# Patient Record
Sex: Male | Born: 1965 | Race: Black or African American | Hispanic: No | Marital: Married | Smoking: Never smoker
Health system: Southern US, Community
[De-identification: ages and names within clinical notes are randomized; demographics above are authoritative.]

## PROBLEM LIST (undated history)

## (undated) DIAGNOSIS — E079 Disorder of thyroid, unspecified: Secondary | ICD-10-CM

## (undated) HISTORY — PX: THYROID SURGERY: SHX805

---

## 2004-01-17 ENCOUNTER — Encounter (HOSPITAL_COMMUNITY): Admission: RE | Admit: 2004-01-17 | Discharge: 2004-04-16 | Payer: Self-pay | Admitting: Internal Medicine

## 2004-07-06 ENCOUNTER — Encounter (HOSPITAL_COMMUNITY): Admission: RE | Admit: 2004-07-06 | Discharge: 2004-10-04 | Payer: Self-pay | Admitting: Internal Medicine

## 2009-11-08 ENCOUNTER — Emergency Department (HOSPITAL_COMMUNITY): Admission: EM | Admit: 2009-11-08 | Discharge: 2009-11-08 | Payer: Self-pay | Admitting: Emergency Medicine

## 2010-10-15 ENCOUNTER — Encounter: Payer: Self-pay | Admitting: Internal Medicine

## 2015-07-24 ENCOUNTER — Emergency Department (HOSPITAL_COMMUNITY)
Admission: EM | Admit: 2015-07-24 | Discharge: 2015-07-24 | Disposition: A | Discharge status: Home / Self Care | Payer: Self-pay | Attending: Emergency Medicine | Admitting: Emergency Medicine

## 2015-07-24 ENCOUNTER — Encounter (HOSPITAL_COMMUNITY): Payer: Self-pay | Admitting: *Deleted

## 2015-07-24 DIAGNOSIS — K047 Periapical abscess without sinus: Secondary | ICD-10-CM | POA: Insufficient documentation

## 2015-07-24 DIAGNOSIS — Z8639 Personal history of other endocrine, nutritional and metabolic disease: Secondary | ICD-10-CM | POA: Insufficient documentation

## 2015-07-24 DIAGNOSIS — Z23 Encounter for immunization: Secondary | ICD-10-CM | POA: Insufficient documentation

## 2015-07-24 HISTORY — DX: Disorder of thyroid, unspecified: E07.9

## 2015-07-24 MED ORDER — HYDROCODONE-ACETAMINOPHEN 5-325 MG PO TABS: 1.0000 | ORAL_TABLET | ORAL | Status: DC | PRN

## 2015-07-24 MED ORDER — IBUPROFEN 800 MG PO TABS: 800.0000 mg | ORAL_TABLET | Freq: Three times a day (TID) | ORAL | Status: DC | PRN

## 2015-07-24 MED ORDER — PENICILLIN V POTASSIUM 500 MG PO TABS: 500.0000 mg | ORAL_TABLET | Freq: Four times a day (QID) | ORAL | Status: AC

## 2015-07-24 MED ORDER — LIDOCAINE-EPINEPHRINE (PF) 2 %-1:200000 IJ SOLN
10.0000 mL | Freq: Once | INTRAMUSCULAR | Status: AC
  Administered 2015-07-24: 10 mL
  Filled 2015-07-24: qty 20

## 2015-07-24 MED ORDER — TETANUS-DIPHTH-ACELL PERTUSSIS 5-2.5-18.5 LF-MCG/0.5 IM SUSP
0.5000 mL | Freq: Once | INTRAMUSCULAR | Status: AC
  Administered 2015-07-24: 0.5 mL via INTRAMUSCULAR
  Filled 2015-07-24: qty 0.5

## 2015-07-24 NOTE — Discharge Instructions (Signed)
Read the information below.  Use the prescribed medication as directed.  Please discuss all new medications with your pharmacist.  Do not take additional tylenol while taking the prescribed pain medication to avoid overdose.  You may return to the Emergency Department at any time for worsening condition or any new symptoms that concern you.    Please call the dentist listed above within 48 hours to schedule a close follow up appointment.  If you develop fevers, swelling in your face, difficulty swallowing or breathing, return to the ER immediately for a recheck.    Dental Abscess A dental abscess is a collection of pus in or around a tooth. CAUSES This condition is caused by a bacterial infection around the root of the tooth that involves the inner part of the tooth (pulp). It may result from:  Severe tooth decay.  Trauma to the tooth that allows bacteria to enter into the pulp, such as a broken or chipped tooth.  Severe gum disease around a tooth. SYMPTOMS Symptoms of this condition include:  Severe pain in and around the infected tooth.  Swelling and redness around the infected tooth, in the mouth, or in the face.  Tenderness.  Pus drainage.  Bad breath.  Bitter taste in the mouth.  Difficulty swallowing.  Difficulty opening the mouth.  Nausea.  Vomiting.  Chills.  Swollen neck glands.  Fever. DIAGNOSIS This condition is diagnosed with examination of the infected tooth. During the exam, your dentist may tap on the infected tooth. Your dentist will also ask about your medical and dental history and may order X-rays. TREATMENT This condition is treated by eliminating the infection. This may be done with:  Antibiotic medicine.  A root canal. This may be performed to save the tooth.  Pulling (extracting) the tooth. This may also involve draining the abscess. This is done if the tooth cannot be saved. HOME CARE INSTRUCTIONS  Take medicines only as directed by your  dentist.  If you were prescribed antibiotic medicine, finish all of it even if you start to feel better.  Rinse your mouth (gargle) often with salt water to relieve pain or swelling.  Do not drive or operate heavy machinery while taking pain medicine.  Do not apply heat to the outside of your mouth.  Keep all follow-up visits as directed by your dentist. This is important. SEEK MEDICAL CARE IF:  Your pain is worse and is not helped by medicine. SEEK IMMEDIATE MEDICAL CARE IF:  You have a fever or chills.  Your symptoms suddenly get worse.  You have a very bad headache.  You have problems breathing or swallowing.  You have trouble opening your mouth.  You have swelling in your neck or around your eye.   This information is not intended to replace advice given to you by your health care provider. Make sure you discuss any questions you have with your health care provider.   Document Released: 09/10/2005 Document Revised: 01/25/2015 Document Reviewed: 09/07/2014 Elsevier Interactive Patient Education 2016 [employer]    Emergency Department Resource Guide 1) Find a Doctor and Pay Out of Pocket Although you won't have to find out who is covered by your insurance plan, it is a good idea to ask around and get recommendations. You will then need to call the office and see if the doctor you have chosen will accept you as a new patient and what types of options they offer for patients who are self-pay. Some doctors offer discounts or  will set up payment plans for their patients who do not have insurance, but you will need to ask so you aren't surprised when you get to your appointment.  2) Contact Your Local Health Department Not all health departments have doctors that can see patients for sick visits, but many do, so it is worth a call to see if yours does. If you don't know where your local health department is, you can check in your phone book. The CDC also has a tool to help  you locate your state's health department, and many state websites also have listings of all of their local health departments.  3) Find a Walk-in Clinic If your illness is not likely to be very severe or complicated, you may want to try a walk in clinic. These are popping up all over the country in pharmacies, drugstores, and shopping centers. They're usually staffed by nurse practitioners or physician assistants that have been trained to treat common illnesses and complaints. They're usually fairly quick and inexpensive. However, if you have serious medical issues or chronic medical problems, these are probably not your best option.  No Primary Care Doctor: - Call Health Connect at  [phone] - they can help you locate a primary care doctor that  accepts your insurance, provides certain services, etc. - Physician Referral Service- [phone]  Chronic Pain Problems: Organization         Address  Phone   Notes  [facility]  [phone] Patients need to be referred by their primary care doctor.   Medication Assistance: Organization         Address  Phone   Notes  [facility] Medication [facility] [address] [place] [place], [place] [address] [phone] --Must be a resident of [facility] -- Must have NO insurance coverage whatsoever (no Medicaid/ Medicare, etc.) -- The pt. MUST have a primary care doctor that directs their care regularly and follows them in the community   Med[facility]ist  [phone]   [employer]  [phone]    Agencies that provide inexpensive medical care: Organization         Address  Phone   Notes  [facility]  [phone]   [facility]    [phone]   [facility] [address] [place], [place] [address] [phone]   [facility] [address] [place] [place] [phone]   [facility]anned Parenthood    [phone]   [facility]    [phone]   [facility]  [address], [place] Phone:  [phone], Fax:  [phone] Hours of Operation:  9 am - 6 pm, M-F.  Also accepts Medicaid/Medicare and self-pay.  [facility]  [address], [place] Phone: [phone], Fax: [phone]. Hours of Operation:  8:30 am - 5:30 pm, M-F.  Also accepts Medicaid and self-pay.  [facility] [place] [address], [place] Phone: [phone]   [facility] [address] Natasha Bence [place], [place] [phone], Ext. [phone] Mondays & Thursdays: 7-9 AM.  First 15 patients are seen on a first come, first serve basis.    Medicaid-accepting [facility] Providers:  Organization         Address  Phone   Notes  [facility] [address], [place] [phone])  [phone] Also accepts self-pay patients.  [facility] [address], [place]  [phone]   [facility] [address], [place] [phone]   [facility] [address] [place] [address], [place] [phone]) [phone]   Renaye RakersVeita Bland [address], [place]   [phone] Only accepts [place] Access [place] patients after they have their name applied to their card.   Self-Pay (no insurance) in [facility]:  Organization         Address  Phone   Notes  Sickle Cell Patients, [facility] [address], [place] [phone]   [facility] [address], [place] [phone]   [facility]  [address], [place] [phone]) [phone]   [facility]/Dr. Osei-Bonsu  [address] [place] [address], [place] or [phone], [place] [phone] Phone number for both [place] and [place] locations is the same.  [facility] [address],  [place] [phone]   [facility] [address] [place] [address], [place] or [address] [phone]   [facility] [address], [place] [phone]) [phone], phone; [phone], fax Sees patients 1st and 3rd Saturday of every month.  Must not qualify for public or private insurance (i.e. Medicaid, Medicare, [place] Health Choice, Veterans' Benefits)  Household income should be no more than 200% of the poverty level The clinic cannot treat you if you are pregnant or think you are pregnant  Sexually transmitted diseases are not treated at the clinic.    Dental Care: Organization         Address  Phone  Notes  [facility] [address], [place] [phone] Accepts children up to age 50 who are enrolled in [place] or [place] Health Choice; pregnant women with a Medicaid card; and children who have applied for Medicaid or [place] Health Choice, but were declined, whose parents can pay a reduced fee at time of service.  [facility] [place]  [address], [place] [phone] Accepts children up to age 50 who are enrolled in [place] or [place] Health Choice; pregnant women with a Medicaid card; and children who have applied for Medicaid or [place] Health Choice, but were declined, whose parents can pay a reduced fee at time of service.  [facility]  [address], [place] [phone]) [phone] Patients are seen by appointment only. Walk-ins are not accepted. [facility] will see patients 50 years of age and older. Monday - Tuesday (8am-5pm) Most Wednesdays (8:30-5pm) $30 per visit, cash only  [facility]  [address], [facility] [phone] Patients are seen by appointment only. Walk-ins are not accepted. [facility] will see patients 50 years of age and older. One Wednesday Evening  (Monthly: Volunteer Based).  $30 per visit, cash only  [facility]  [phone] for adults; Children under age 514, call Graduate Pediatric Dentistry at [phone]. Children aged 554-14, please call [phone] to request a pediatric application.  Dental services are provided in all areas of dental care including fillings, crowns and bridges, complete and partial dentures, implants, gum treatment, root canals, and extractions. Preventive care is also provided.  is provided to both adults and children. °Patients are selected via a lottery and there is often a waiting list. °  °[facility] [address], °[place] ° [phone] [email or web address] °  °[facility] [address], [place], [place] [phone], Ext. [phone] Second and Fourth Thursday of each month, opens at 6:30 AM; Clinic ends at 9 AM.  Patients are seen on a first-come first-served basis, and a limited number are seen during each clinic.  ° °[facility] ° [address], [place], [place] [phone]   Eligibility Requirements °You must have lived in [place], [place], or [place] counties for at least the last three months. °  You cannot be eligible for state or federal sponsored healthcare insurance, including Veterans Administration, Medicaid, or Medicare. °  You generally cannot be eligible for healthcare insurance through your employer.  °  How to apply: °Eligibility screenings are held every Tuesday and Wednesday afternoon from 1:00 pm until 4:00 pm. You do not need an appointment for the interview!  °[facility] [address], [place], [place] [phone]   °[facility]  336-342-8273   °[place]  336-703-3100   °[facility]  336-570-6415   ° °Behavioral Health Resources in the Community: °Intensive Outpatient Programs °Organization         Address  Phone  Notes  °[place]  [facility] [address], [place], [place] [phone]   °[facility] [address], [place], [place] 336-832-9800   °[facility] [address], [place], [place] ° 336-882-2125   °[facility] [address],  °[place], [place] 1-800-853-5163 or 336-641-4981   °Substance Abuse Resources °Organization         Address  Phone  Notes  °[facility]  336-882-2125   °[facility]  336-784-9470   °[facility]  336-285-9073   °[facility]  336-845-3988   °Residential & Outpatient Substance Abuse Program  1-800-659-3381   °Psychological Services °Organization         Address  Phone  Notes  °[facility]  336- 832-9600   °[facility]  336- 378-7881   °[facility] [address], [place] 1-800-853-5163 or 336-641-4981   ° °Mobile Crisis Teams °Organization         Address  Phone  Notes  °[facility], Mobile Crisis Care Unit  1-877-626-1772   °Assertive °[facility]otherapeutic Services ° [address] [place], [place] 336-834-9664   °Sharon DeEsch [address] °[place] 336-554-5454   ° °Self-Help/Support Groups °Organization         Address  Phone             Notes  °[facility] [place] - variety of support groups  336- 373-1402 Call for more information  °Narcotics Anonymous (NA), [facility] [address], °[place]  2 meetings at this location  ° °Residential Treatment Programs °Organization         Address  Phone  Notes  °[facility] [address],    °[place]  1-866-801-8205   °[facility] ° [address], [place], [place] 704-293-8524   °[facility] [address], [place] 336-845-3988 Admissions: 8am-3pm M-F  °[facility] [address]    °[place], [place]   336-841-1104   °[facility] [address],  [place], [place] 336-379-7146   °[facility] [address]  °[place], [place] 336-285-9073   °[facility] Intensive Outpatient [address], [place], [place] 336-852-3033   °[facility] [address]  °[place], [place] 1-877-615-2722 or 336-784-9470   °[facility] ([facility]) [address] [place], [place] 336-227-7417 Accepts Medicaid  °[facility] [address]  °[place] 1-800-659-3381 Substance Abuse/Addiction Treatment  ° °[place] [facility] Resources °Organization         Address  Phone  Notes  °[facility]  (888) 581-9988   °Julie Brannon, PhD [address] [place], [place]   (336) 349-5553 or (336) 951-0000   °[facility]   [address] °[place], [place] (336) 349-4454   °[facility] [address], [place], [place] (336) 342-8316 Insurance/Medicaid/sponsorship through [facility]  °[facility] [address]                                    [place], [place] (336) 342-8316 Therapy/tele-psych/case  °[facility] [address]  ° [place], [place] (336) 349-2233    °Dr. Arfeen  (336) 349-4544   °[facility] [place]  [employer] [place] [facility] 1) [address],  °2) [address], [place] °3)  [address], [place] (336) 349-3220 °(336) 342-7768 ° °(336) 342-8140   °[place] Child Abuse Hotline (336) 342-1394 or (336) 342-3537 (After Hours)    ° ° ° ° °

## 2015-07-24 NOTE — ED Provider Notes (Signed)
CSN: [id number]     Arrival date & time 07/24/15  0806 History   First MD Initiated Contact with Patient 07/24/15 0831     Chief Complaint  Patient presents with  . Oral Swelling     (Consider location/radiation/quality/duration/timing/severity/associated sxs/prior Treatment) HPI   Pt presents with right lower jaw swelling near his chin that began two days ago.  It feels like a hard knot that is sore and increasing in size.  Tender to palpation.  Denies fevers, chills, myalgias, sore throat, difficulty swallowing or breathing.  Denies dental pain.    Past Medical History  Diagnosis Date  . Thyroid disease     hyper    Past Surgical History  Procedure Laterality Date  . Thyroid surgery      radiation   History reviewed. No pertinent family history. Social History  Substance Use Topics  . Smoking status: Never Smoker   . Smokeless tobacco: Never Used  . Alcohol Use: Yes     Comment: social    Review of Systems  Constitutional: Negative for fever and chills.  HENT: Positive for facial swelling. Negative for dental problem, drooling, sore throat and trouble swallowing.   Respiratory: Negative for shortness of breath.   Skin: Negative for rash.  Allergic/Immunologic: Negative for immunocompromised state.      Allergies  Review of patient's allergies indicates not on file.  Home Medications   Prior to Admission medications   Not on File   BP 136/88 mmHg  Pulse 65  Temp(Src) 98.8 F (37.1 C) (Oral)  Resp 18  Ht  (1.803 m)  Wt 195 lb (88.451 kg)  BMI 27.21 kg/m2  SpO2 100% Physical Exam  Constitutional: He appears well-developed and well-nourished. No distress.  HENT:  Head: Normocephalic and atraumatic.  Mouth/Throat:    Right second bicuspid with remote fracture.   Neck: Neck supple.  Pulmonary/Chest: Effort normal.  Neurological: He is alert.  Skin: He is not diaphoretic.  Nursing note and vitals reviewed.   ED Course  Procedures  (including critical care time) Labs Review Labs Reviewed - No data to display  Imaging Review No results found. I have personally reviewed and evaluated these images and lab results as part of my medical decision-making.   EKG Interpretation None       INCISION AND DRAINAGE Performed by: Trixie Dredge Consent: Verbal consent obtained. Risks and benefits: risks, benefits and alternatives were discussed Type: abscess  Body area: dental abscess, right lower gingiva adjacent to 2nd bicuspid  Anesthesia: local infiltration  Incision was made with a scalpel.  Local anesthetic: lidocaine 2% with epinephrine  Anesthetic total: 1 ml  Complexity: complex Blunt dissection to break up loculations  Drainage: purulent  Drainage amount: moderate  Patient tolerance: Patient tolerated the procedure well with no immediate complications.     MDM   Final diagnoses:  Dental abscess    Afebrile, nontoxic patient with new obvious dental abscess. Dental obvious is fluctuant and very obvious.  Discussed possibility of I&D in ED as temporizing measure and pt readily agreed as it will help take the pressure off that is causing his pain.  I&D with improvement.  Pt strongly advised that he will still have to follow closely with a dentist and that he should fill his antibiotics upon leaving the ED.  Discussed return precautions.  Clinical findings not concerning for Ludwig's angina.  D/C home with antibiotic, pain medication and dental follow up.  Discussed findings, treatment, and follow up  with patient.  Pt given return precautions.  Pt verbalizes understanding and agrees with plan.         [place]ily Garret Teale, PA-C 07/24/15 [id number]  Lavera [place] Duo Liu, MD 07/24/15 [phone]

## 2015-07-24 NOTE — ED Notes (Signed)
Declined W/C at D/C and was escorted to lobby by RN. 

## 2015-07-24 NOTE — ED Notes (Signed)
Pt reports swelling to RT side of mouth. Pt states he is eating and drinking . Pt denies any difficulty breathing.

## 2021-02-08 ENCOUNTER — Other Ambulatory Visit: Payer: Self-pay

## 2021-02-08 ENCOUNTER — Inpatient Hospital Stay (HOSPITAL_COMMUNITY): Payer: Self-pay

## 2021-02-08 ENCOUNTER — Emergency Department (HOSPITAL_COMMUNITY): Payer: Self-pay

## 2021-02-08 ENCOUNTER — Inpatient Hospital Stay (HOSPITAL_COMMUNITY)
Admission: EM | Admit: 2021-02-08 | Discharge: 2021-02-15 | DRG: 956 | Disposition: A | Discharge status: Rehabilitation Facility | Payer: Self-pay | Attending: Surgery | Admitting: Surgery

## 2021-02-08 ENCOUNTER — Encounter (HOSPITAL_COMMUNITY): Payer: Self-pay | Admitting: Emergency Medicine

## 2021-02-08 DIAGNOSIS — R52 Pain, unspecified: Secondary | ICD-10-CM

## 2021-02-08 DIAGNOSIS — S32401A Unspecified fracture of right acetabulum, initial encounter for closed fracture: Secondary | ICD-10-CM

## 2021-02-08 DIAGNOSIS — S32409A Unspecified fracture of unspecified acetabulum, initial encounter for closed fracture: Secondary | ICD-10-CM

## 2021-02-08 DIAGNOSIS — S42302A Unspecified fracture of shaft of humerus, left arm, initial encounter for closed fracture: Secondary | ICD-10-CM

## 2021-02-08 DIAGNOSIS — S0591XA Unspecified injury of right eye and orbit, initial encounter: Secondary | ICD-10-CM

## 2021-02-08 DIAGNOSIS — S73004A Unspecified dislocation of right hip, initial encounter: Secondary | ICD-10-CM

## 2021-02-08 DIAGNOSIS — S0101XA Laceration without foreign body of scalp, initial encounter: Secondary | ICD-10-CM

## 2021-02-08 DIAGNOSIS — T1490XA Injury, unspecified, initial encounter: Secondary | ICD-10-CM

## 2021-02-08 DIAGNOSIS — S42322A Displaced transverse fracture of shaft of humerus, left arm, initial encounter for closed fracture: Secondary | ICD-10-CM | POA: Insufficient documentation

## 2021-02-08 DIAGNOSIS — Z419 Encounter for procedure for purposes other than remedying health state, unspecified: Secondary | ICD-10-CM

## 2021-02-08 DIAGNOSIS — S32442A Displaced fracture of posterior column [ilioischial] of left acetabulum, initial encounter for closed fracture: Secondary | ICD-10-CM

## 2021-02-08 DIAGNOSIS — S73005A Unspecified dislocation of left hip, initial encounter: Secondary | ICD-10-CM

## 2021-02-08 DIAGNOSIS — S42321A Displaced transverse fracture of shaft of humerus, right arm, initial encounter for closed fracture: Secondary | ICD-10-CM

## 2021-02-08 DIAGNOSIS — S0181XA Laceration without foreign body of other part of head, initial encounter: Secondary | ICD-10-CM

## 2021-02-08 DIAGNOSIS — S32441A Displaced fracture of posterior column [ilioischial] of right acetabulum, initial encounter for closed fracture: Secondary | ICD-10-CM | POA: Insufficient documentation

## 2021-02-08 DIAGNOSIS — T07XXXA Unspecified multiple injuries, initial encounter: Secondary | ICD-10-CM

## 2021-02-08 DIAGNOSIS — S322XXA Fracture of coccyx, initial encounter for closed fracture: Secondary | ICD-10-CM | POA: Diagnosis present

## 2021-02-08 DIAGNOSIS — D696 Thrombocytopenia, unspecified: Secondary | ICD-10-CM | POA: Diagnosis present

## 2021-02-08 DIAGNOSIS — E876 Hypokalemia: Secondary | ICD-10-CM | POA: Diagnosis not present

## 2021-02-08 DIAGNOSIS — H5704 Mydriasis: Secondary | ICD-10-CM | POA: Diagnosis present

## 2021-02-08 DIAGNOSIS — D62 Acute posthemorrhagic anemia: Secondary | ICD-10-CM | POA: Diagnosis present

## 2021-02-08 DIAGNOSIS — S058X1A Other injuries of right eye and orbit, initial encounter: Secondary | ICD-10-CM | POA: Diagnosis present

## 2021-02-08 DIAGNOSIS — S0285XA Fracture of orbit, unspecified, initial encounter for closed fracture: Secondary | ICD-10-CM | POA: Diagnosis present

## 2021-02-08 DIAGNOSIS — Z23 Encounter for immunization: Secondary | ICD-10-CM

## 2021-02-08 DIAGNOSIS — H547 Unspecified visual loss: Secondary | ICD-10-CM | POA: Diagnosis present

## 2021-02-08 DIAGNOSIS — E039 Hypothyroidism, unspecified: Secondary | ICD-10-CM | POA: Diagnosis present

## 2021-02-08 DIAGNOSIS — H538 Other visual disturbances: Secondary | ICD-10-CM | POA: Diagnosis present

## 2021-02-08 DIAGNOSIS — E871 Hypo-osmolality and hyponatremia: Secondary | ICD-10-CM | POA: Diagnosis present

## 2021-02-08 DIAGNOSIS — Y92410 Unspecified street and highway as the place of occurrence of the external cause: Secondary | ICD-10-CM

## 2021-02-08 DIAGNOSIS — S0501XA Injury of conjunctiva and corneal abrasion without foreign body, right eye, initial encounter: Secondary | ICD-10-CM | POA: Diagnosis present

## 2021-02-08 DIAGNOSIS — S36892A Contusion of other intra-abdominal organs, initial encounter: Secondary | ICD-10-CM | POA: Diagnosis present

## 2021-02-08 DIAGNOSIS — G8918 Other acute postprocedural pain: Secondary | ICD-10-CM

## 2021-02-08 DIAGNOSIS — Z20822 Contact with and (suspected) exposure to covid-19: Secondary | ICD-10-CM | POA: Diagnosis present

## 2021-02-08 DIAGNOSIS — Z91014 Allergy to mammalian meats: Secondary | ICD-10-CM

## 2021-02-08 LAB — COMPREHENSIVE METABOLIC PANEL
ALT: 32 U/L (ref 0–44)
AST: 44 U/L — ABNORMAL HIGH (ref 15–41)
Albumin: 3.7 g/dL (ref 3.5–5.0)
Alkaline Phosphatase: 45 U/L (ref 38–126)
Anion gap: 10 (ref 5–15)
BUN: 13 mg/dL (ref 6–20)
CO2: 23 mmol/L (ref 22–32)
Calcium: 8.6 mg/dL — ABNORMAL LOW (ref 8.9–10.3)
Chloride: 104 mmol/L (ref 98–111)
Creatinine, Ser: 1.69 mg/dL — ABNORMAL HIGH (ref 0.61–1.24)
GFR, Estimated: 47 mL/min — ABNORMAL LOW (ref >60)
Glucose, Bld: 171 mg/dL — ABNORMAL HIGH (ref 70–99)
Potassium: 3.5 mmol/L (ref 3.5–5.1)
Sodium: 137 mmol/L (ref 135–145)
Total Bilirubin: 0.8 mg/dL (ref 0.3–1.2)
Total Protein: 6.2 g/dL — ABNORMAL LOW (ref 6.5–8.1)

## 2021-02-08 LAB — CBC
HCT: 38.6 % — ABNORMAL LOW (ref 39.0–52.0)
Hemoglobin: 12.9 g/dL — ABNORMAL LOW (ref 13.0–17.0)
MCH: 31.5 pg (ref 26.0–34.0)
MCHC: 33.4 g/dL (ref 30.0–36.0)
MCV: 94.1 fL (ref 80.0–100.0)
Platelets: 281 10*3/uL (ref 150–400)
RBC: 4.1 MIL/uL — ABNORMAL LOW (ref 4.22–5.81)
RDW: 12.5 % (ref 11.5–15.5)
WBC: 5.7 10*3/uL (ref 4.0–10.5)
nRBC: 0 % (ref 0.0–0.2)

## 2021-02-08 LAB — I-STAT CHEM 8, ED
BUN: 15 mg/dL (ref 6–20)
Calcium, Ion: 1.09 mmol/L — ABNORMAL LOW (ref 1.15–1.40)
Chloride: 104 mmol/L (ref 98–111)
Creatinine, Ser: 1.7 mg/dL — ABNORMAL HIGH (ref 0.61–1.24)
Glucose, Bld: 165 mg/dL — ABNORMAL HIGH (ref 70–99)
HCT: 37 % — ABNORMAL LOW (ref 39.0–52.0)
Hemoglobin: 12.6 g/dL — ABNORMAL LOW (ref 13.0–17.0)
Potassium: 3.5 mmol/L (ref 3.5–5.1)
Sodium: 141 mmol/L (ref 135–145)
TCO2: 23 mmol/L (ref 22–32)

## 2021-02-08 LAB — PROTIME-INR
INR: 1 (ref 0.8–1.2)
Prothrombin Time: 13.2 seconds (ref 11.4–15.2)

## 2021-02-08 LAB — RESP PANEL BY RT-PCR (FLU A&B, COVID) ARPGX2
Influenza A by PCR: NEGATIVE
Influenza B by PCR: NEGATIVE
SARS Coronavirus 2 by RT PCR: NEGATIVE

## 2021-02-08 LAB — LACTIC ACID, PLASMA: Lactic Acid, Venous: 3 mmol/L (ref 0.5–1.9)

## 2021-02-08 LAB — ETHANOL: Alcohol, Ethyl (B): 135 mg/dL — ABNORMAL HIGH (ref <10)

## 2021-02-08 MED ORDER — TETANUS-DIPHTH-ACELL PERTUSSIS 5-2.5-18.5 LF-MCG/0.5 IM SUSY
0.5000 mL | PREFILLED_SYRINGE | Freq: Once | INTRAMUSCULAR | Status: AC
  Administered 2021-02-08: 0.5 mL via INTRAMUSCULAR

## 2021-02-08 MED ORDER — HYDROMORPHONE HCL 1 MG/ML IJ SOLN
INTRAMUSCULAR | Status: AC
  Administered 2021-02-08: 1 mg
  Filled 2021-02-08: qty 1

## 2021-02-08 MED ORDER — ENOXAPARIN SODIUM 30 MG/0.3ML IJ SOSY: 30.0000 mg | PREFILLED_SYRINGE | Freq: Two times a day (BID) | INTRAMUSCULAR | Status: DC

## 2021-02-08 MED ORDER — HYDROMORPHONE HCL 1 MG/ML IJ SOLN
1.0000 mg | INTRAMUSCULAR | Status: DC | PRN
  Administered 2021-02-09 – 2021-02-10 (×3): 1 mg via INTRAVENOUS
  Filled 2021-02-08 (×5): qty 1

## 2021-02-08 MED ORDER — ONDANSETRON 4 MG PO TBDP
4.0000 mg | ORAL_TABLET | Freq: Four times a day (QID) | ORAL | Status: DC | PRN
Start: 2021-02-08 — End: 2021-02-15

## 2021-02-08 MED ORDER — DEXTROSE-NACL 5-0.9 % IV SOLN: INTRAVENOUS | Status: DC

## 2021-02-08 MED ORDER — HYDROMORPHONE HCL 1 MG/ML IJ SOLN
1.0000 mg | Freq: Once | INTRAMUSCULAR | Status: AC
Start: 2021-02-08 — End: 2021-02-08
  Administered 2021-02-08: 1 mg via INTRAVENOUS

## 2021-02-08 MED ORDER — PROPOFOL 10 MG/ML IV BOLUS
INTRAVENOUS | Status: AC | PRN
  Administered 2021-02-08: 50 mg via INTRAVENOUS

## 2021-02-08 MED ORDER — PROPOFOL 10 MG/ML IV BOLUS
0.5000 mg/kg | Freq: Once | INTRAVENOUS | Status: DC
  Filled 2021-02-08: qty 20

## 2021-02-08 MED ORDER — ONDANSETRON HCL 4 MG/2ML IJ SOLN: 4.0000 mg | Freq: Four times a day (QID) | INTRAMUSCULAR | Status: DC | PRN

## 2021-02-08 MED ORDER — CEFAZOLIN IN SODIUM CHLORIDE 3-0.9 GM/100ML-% IV SOLN: 3.0000 g | INTRAVENOUS | Status: DC

## 2021-02-08 MED ORDER — IOHEXOL 300 MG/ML  SOLN
75.0000 mL | Freq: Once | INTRAMUSCULAR | Status: AC | PRN
  Administered 2021-02-08: 75 mL via INTRAVENOUS

## 2021-02-08 MED ORDER — SODIUM CHLORIDE 0.9 % IV SOLN
INTRAVENOUS | Status: AC | PRN
  Administered 2021-02-08: 1000 mL via INTRAVENOUS

## 2021-02-08 MED ORDER — HYDROMORPHONE HCL 1 MG/ML IJ SOLN
INTRAMUSCULAR | Status: AC
  Administered 2021-02-09: 1 mg via INTRAVENOUS
  Filled 2021-02-08: qty 1

## 2021-02-08 MED ORDER — CEFAZOLIN SODIUM-DEXTROSE 2-4 GM/100ML-% IV SOLN
2.0000 g | Freq: Once | INTRAVENOUS | Status: AC
  Administered 2021-02-08: 2 g via INTRAVENOUS
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
## 2021-02-08 NOTE — Progress Notes (Signed)
Orthopedic Tech Progress Note Patient Details:  [name] Jun 05, 1966 [id number] Level 1 Trauma  Patient ID: [name], male   DOB: 10/19/1965, 55 y.o.   MRN: [id number]   [name] 02/08/2021, 10:46 PM

## 2021-02-08 NOTE — ED Notes (Signed)
2nd unit PRBC infusing.
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
## 2021-02-08 NOTE — ED Notes (Signed)
Patient transported to CT scan . 
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
## 2021-02-08 NOTE — ED Notes (Signed)
Patient signed consent form for procedural sedation to manually reduced his right hip by EDP.

## 2021-02-08 NOTE — ED Notes (Signed)
1st unit PRBC infusing 

## 2021-02-09 ENCOUNTER — Inpatient Hospital Stay (HOSPITAL_COMMUNITY): Payer: Self-pay | Admitting: Anesthesiology

## 2021-02-09 ENCOUNTER — Encounter (HOSPITAL_COMMUNITY): Payer: Self-pay

## 2021-02-09 ENCOUNTER — Inpatient Hospital Stay (HOSPITAL_COMMUNITY): Payer: Self-pay

## 2021-02-09 ENCOUNTER — Encounter (HOSPITAL_COMMUNITY): Admission: EM | Disposition: A | Discharge status: Rehabilitation Facility | Payer: Self-pay | Source: Home / Self Care

## 2021-02-09 DIAGNOSIS — S0101XA Laceration without foreign body of scalp, initial encounter: Secondary | ICD-10-CM

## 2021-02-09 DIAGNOSIS — S0181XA Laceration without foreign body of other part of head, initial encounter: Secondary | ICD-10-CM

## 2021-02-09 HISTORY — PX: FACIAL LACERATION REPAIR: SHX6589

## 2021-02-09 LAB — BASIC METABOLIC PANEL
Anion gap: 6 (ref 5–15)
BUN: 12 mg/dL (ref 6–20)
CO2: 25 mmol/L (ref 22–32)
Calcium: 8.1 mg/dL — ABNORMAL LOW (ref 8.9–10.3)
Chloride: 107 mmol/L (ref 98–111)
Creatinine, Ser: 1.45 mg/dL — ABNORMAL HIGH (ref 0.61–1.24)
GFR, Estimated: 57 mL/min — ABNORMAL LOW (ref >60)
Glucose, Bld: 154 mg/dL — ABNORMAL HIGH (ref 70–99)
Potassium: 4.8 mmol/L (ref 3.5–5.1)
Sodium: 138 mmol/L (ref 135–145)

## 2021-02-09 LAB — CBC
HCT: 35 % — ABNORMAL LOW (ref 39.0–52.0)
Hemoglobin: 11.8 g/dL — ABNORMAL LOW (ref 13.0–17.0)
MCH: 31.1 pg (ref 26.0–34.0)
MCHC: 33.7 g/dL (ref 30.0–36.0)
MCV: 92.1 fL (ref 80.0–100.0)
Platelets: 206 10*3/uL (ref 150–400)
RBC: 3.8 MIL/uL — ABNORMAL LOW (ref 4.22–5.81)
RDW: 13.4 % (ref 11.5–15.5)
WBC: 14.2 10*3/uL — ABNORMAL HIGH (ref 4.0–10.5)
nRBC: 0 % (ref 0.0–0.2)

## 2021-02-09 LAB — HIV ANTIBODY (ROUTINE TESTING W REFLEX): HIV Screen 4th Generation wRfx: NONREACTIVE

## 2021-02-09 LAB — ABO/RH: ABO/RH(D): O POS

## 2021-02-09 LAB — MRSA PCR SCREENING: MRSA by PCR: NEGATIVE

## 2021-02-09 SURGERY — REPAIR, LACERATION, FACE
Anesthesia: General | Site: Face

## 2021-02-09 SURGERY — REPAIR, LACERATION, FACE
Anesthesia: General

## 2021-02-09 MED ORDER — OXYCODONE HCL 5 MG/5ML PO SOLN: 5.0000 mg | Freq: Once | ORAL | Status: DC | PRN

## 2021-02-09 MED ORDER — ONDANSETRON HCL 4 MG/2ML IJ SOLN
INTRAMUSCULAR | Status: DC | PRN
  Administered 2021-02-09: 4 mg via INTRAVENOUS

## 2021-02-09 MED ORDER — BACITRACIN-POLYMYXIN B 500-10000 UNIT/GM OP OINT
TOPICAL_OINTMENT | OPHTHALMIC | Status: DC | PRN
  Administered 2021-02-09: 1 via OPHTHALMIC

## 2021-02-09 MED ORDER — ENOXAPARIN SODIUM 30 MG/0.3ML IJ SOSY
30.0000 mg | PREFILLED_SYRINGE | Freq: Once | INTRAMUSCULAR | Status: AC
  Administered 2021-02-09: 30 mg via SUBCUTANEOUS
  Filled 2021-02-09: qty 0.3

## 2021-02-09 MED ORDER — LIDOCAINE-EPINEPHRINE 1 %-1:100000 IJ SOLN
INTRAMUSCULAR | Status: AC
  Filled 2021-02-09: qty 1

## 2021-02-09 MED ORDER — BACITRACIN ZINC 500 UNIT/GM EX OINT
TOPICAL_OINTMENT | CUTANEOUS | Status: AC
  Filled 2021-02-09: qty 28.35

## 2021-02-09 MED ORDER — LIDOCAINE-EPINEPHRINE 1 %-1:100000 IJ SOLN
20.0000 mL | Freq: Once | INTRAMUSCULAR | Status: AC
  Administered 2021-02-09: 20 mL via INTRADERMAL
  Filled 2021-02-09: qty 20

## 2021-02-09 MED ORDER — PROPOFOL 10 MG/ML IV BOLUS
INTRAVENOUS | Status: AC
  Filled 2021-02-09: qty 20

## 2021-02-09 MED ORDER — OXYCODONE HCL 5 MG PO TABS: 5.0000 mg | ORAL_TABLET | Freq: Once | ORAL | Status: DC | PRN

## 2021-02-09 MED ORDER — FENTANYL CITRATE (PF) 100 MCG/2ML IJ SOLN
INTRAMUSCULAR | Status: AC
  Filled 2021-02-09: qty 2

## 2021-02-09 MED ORDER — MIDAZOLAM HCL 5 MG/5ML IJ SOLN
INTRAMUSCULAR | Status: DC | PRN
  Administered 2021-02-09: 2 mg via INTRAVENOUS

## 2021-02-09 MED ORDER — ARTIFICIAL TEARS OPHTHALMIC OINT
1.0000 "application " | TOPICAL_OINTMENT | Freq: Four times a day (QID) | OPHTHALMIC | Status: DC
  Administered 2021-02-09: 1 via OPHTHALMIC
  Filled 2021-02-09: qty 3.5

## 2021-02-09 MED ORDER — DEXMEDETOMIDINE (PRECEDEX) IN NS 20 MCG/5ML (4 MCG/ML) IV SYRINGE
PREFILLED_SYRINGE | INTRAVENOUS | Status: DC | PRN
  Administered 2021-02-09: 12 ug via INTRAVENOUS

## 2021-02-09 MED ORDER — ARTIFICIAL TEARS OPHTHALMIC OINT: TOPICAL_OINTMENT | OPHTHALMIC | Status: DC

## 2021-02-09 MED ORDER — FENTANYL CITRATE (PF) 100 MCG/2ML IJ SOLN
25.0000 ug | INTRAMUSCULAR | Status: DC | PRN
  Administered 2021-02-09: 50 ug via INTRAVENOUS

## 2021-02-09 MED ORDER — SUGAMMADEX SODIUM 200 MG/2ML IV SOLN
INTRAVENOUS | Status: DC | PRN
  Administered 2021-02-09: 300 mg via INTRAVENOUS

## 2021-02-09 MED ORDER — CEFAZOLIN SODIUM-DEXTROSE 2-4 GM/100ML-% IV SOLN
2.0000 g | INTRAVENOUS | Status: AC
  Administered 2021-02-10 (×2): 2 g via INTRAVENOUS
  Filled 2021-02-09: qty 100

## 2021-02-09 MED ORDER — FENTANYL CITRATE (PF) 250 MCG/5ML IJ SOLN
INTRAMUSCULAR | Status: AC
  Filled 2021-02-09: qty 5

## 2021-02-09 MED ORDER — BACITRACIN-POLYMYXIN B 500-10000 UNIT/GM OP OINT
1.0000 "application " | TOPICAL_OINTMENT | Freq: Two times a day (BID) | OPHTHALMIC | Status: DC
  Administered 2021-02-10 – 2021-02-15 (×11): 1 via OPHTHALMIC
  Filled 2021-02-09: qty 3.5

## 2021-02-09 MED ORDER — TRANEXAMIC ACID-NACL 1000-0.7 MG/100ML-% IV SOLN
1000.0000 mg | INTRAVENOUS | Status: DC
  Filled 2021-02-09: qty 100

## 2021-02-09 MED ORDER — DEXAMETHASONE SODIUM PHOSPHATE 10 MG/ML IJ SOLN
INTRAMUSCULAR | Status: DC | PRN
  Administered 2021-02-09: 10 mg via INTRAVENOUS

## 2021-02-09 MED ORDER — ONDANSETRON HCL 4 MG/2ML IJ SOLN
INTRAMUSCULAR | Status: AC
  Filled 2021-02-09: qty 2

## 2021-02-09 MED ORDER — ENSURE PRE-SURGERY PO LIQD
296.0000 mL | Freq: Once | ORAL | Status: AC
  Administered 2021-02-10: 296 mL via ORAL

## 2021-02-09 MED ORDER — PHENYLEPHRINE 40 MCG/ML (10ML) SYRINGE FOR IV PUSH (FOR BLOOD PRESSURE SUPPORT)
PREFILLED_SYRINGE | INTRAVENOUS | Status: AC
  Filled 2021-02-09: qty 20

## 2021-02-09 MED ORDER — PROPOFOL 10 MG/ML IV BOLUS
0.5000 mg/kg | Freq: Once | INTRAVENOUS | Status: DC
  Filled 2021-02-09: qty 20

## 2021-02-09 MED ORDER — BACITRACIN-POLYMYXIN B 500-10000 UNIT/GM OP OINT
1.0000 "application " | TOPICAL_OINTMENT | Freq: Three times a day (TID) | OPHTHALMIC | Status: DC
  Filled 2021-02-09 (×2): qty 3.5

## 2021-02-09 MED ORDER — CHLORHEXIDINE GLUCONATE 4 % EX LIQD
60.0000 mL | Freq: Once | CUTANEOUS | Status: AC
  Administered 2021-02-10: 4 via TOPICAL
  Filled 2021-02-09: qty 60

## 2021-02-09 MED ORDER — AMOXICILLIN 500 MG PO CAPS
500.0000 mg | ORAL_CAPSULE | Freq: Three times a day (TID) | ORAL | Status: DC
  Administered 2021-02-09 – 2021-02-11 (×7): 500 mg via ORAL
  Filled 2021-02-09 (×9): qty 1

## 2021-02-09 MED ORDER — ENSURE PRE-SURGERY PO LIQD
296.0000 mL | Freq: Once | ORAL | Status: DC
  Filled 2021-02-09: qty 296

## 2021-02-09 MED ORDER — PROPOFOL 10 MG/ML IV BOLUS
INTRAVENOUS | Status: AC | PRN
  Administered 2021-02-09: 10 mg via INTRAVENOUS
  Administered 2021-02-09: 50 mg via INTRAVENOUS
  Administered 2021-02-09: 10 mg via INTRAVENOUS

## 2021-02-09 MED ORDER — MIDAZOLAM HCL 2 MG/2ML IJ SOLN
INTRAMUSCULAR | Status: AC
  Filled 2021-02-09: qty 2

## 2021-02-09 MED ORDER — ACETAMINOPHEN 10 MG/ML IV SOLN
1000.0000 mg | Freq: Four times a day (QID) | INTRAVENOUS | Status: DC
  Administered 2021-02-09: 1000 mg via INTRAVENOUS
  Filled 2021-02-09 (×2): qty 100

## 2021-02-09 MED ORDER — SUCCINYLCHOLINE CHLORIDE 200 MG/10ML IV SOSY
PREFILLED_SYRINGE | INTRAVENOUS | Status: AC
  Filled 2021-02-09: qty 10

## 2021-02-09 MED ORDER — METHOCARBAMOL 1000 MG/10ML IJ SOLN
500.0000 mg | Freq: Three times a day (TID) | INTRAVENOUS | Status: DC
  Administered 2021-02-09: 500 mg via INTRAVENOUS
  Filled 2021-02-09: qty 5

## 2021-02-09 MED ORDER — PROPOFOL 10 MG/ML IV BOLUS
INTRAVENOUS | Status: DC | PRN
  Administered 2021-02-09: 150 mg via INTRAVENOUS

## 2021-02-09 MED ORDER — LIDOCAINE HCL (CARDIAC) PF 100 MG/5ML IV SOSY
PREFILLED_SYRINGE | INTRAVENOUS | Status: DC | PRN
  Administered 2021-02-09: 60 mg via INTRAVENOUS

## 2021-02-09 MED ORDER — OXYCODONE HCL 5 MG PO TABS
5.0000 mg | ORAL_TABLET | ORAL | Status: DC | PRN
  Administered 2021-02-09 (×2): 10 mg via ORAL
  Filled 2021-02-09 (×2): qty 2

## 2021-02-09 MED ORDER — ACETAMINOPHEN 325 MG PO TABS
650.0000 mg | ORAL_TABLET | Freq: Four times a day (QID) | ORAL | Status: DC
  Administered 2021-02-09 – 2021-02-13 (×11): 650 mg via ORAL
  Filled 2021-02-09 (×11): qty 2

## 2021-02-09 MED ORDER — ENOXAPARIN SODIUM 30 MG/0.3ML IJ SOSY: 30.0000 mg | PREFILLED_SYRINGE | Freq: Once | INTRAMUSCULAR | Status: DC

## 2021-02-09 MED ORDER — ALBUMIN HUMAN 5 % IV SOLN: INTRAVENOUS | Status: DC | PRN

## 2021-02-09 MED ORDER — BACITRACIN-POLYMYXIN B 500-10000 UNIT/GM OP OINT: TOPICAL_OINTMENT | Freq: Three times a day (TID) | OPHTHALMIC | Status: DC

## 2021-02-09 MED ORDER — PHENYLEPHRINE HCL (PRESSORS) 10 MG/ML IV SOLN
INTRAVENOUS | Status: DC | PRN
  Administered 2021-02-09: 80 ug via INTRAVENOUS
  Administered 2021-02-09 (×3): 120 ug via INTRAVENOUS

## 2021-02-09 MED ORDER — ROCURONIUM BROMIDE 100 MG/10ML IV SOLN
INTRAVENOUS | Status: DC | PRN
  Administered 2021-02-09: 50 mg via INTRAVENOUS

## 2021-02-09 MED ORDER — FENTANYL CITRATE (PF) 100 MCG/2ML IJ SOLN
INTRAMUSCULAR | Status: DC | PRN
  Administered 2021-02-09 (×2): 50 ug via INTRAVENOUS

## 2021-02-09 MED ORDER — ROCURONIUM BROMIDE 10 MG/ML (PF) SYRINGE
PREFILLED_SYRINGE | INTRAVENOUS | Status: AC
  Filled 2021-02-09: qty 10

## 2021-02-09 MED ORDER — LACTATED RINGERS IV SOLN: INTRAVENOUS | Status: DC | PRN

## 2021-02-09 MED ORDER — PROMETHAZINE HCL 25 MG/ML IJ SOLN: 6.2500 mg | INTRAMUSCULAR | Status: DC | PRN

## 2021-02-09 MED ORDER — ARTIFICIAL TEARS OPHTHALMIC OINT
TOPICAL_OINTMENT | OPHTHALMIC | Status: DC
  Administered 2021-02-09: 1 via OPHTHALMIC
  Filled 2021-02-09: qty 3.5

## 2021-02-09 MED ORDER — DOCUSATE SODIUM 100 MG PO CAPS
100.0000 mg | ORAL_CAPSULE | Freq: Two times a day (BID) | ORAL | Status: DC
  Administered 2021-02-09 – 2021-02-15 (×10): 100 mg via ORAL
  Filled 2021-02-09 (×11): qty 1

## 2021-02-09 MED ORDER — ARTIFICIAL TEARS OPHTHALMIC OINT
TOPICAL_OINTMENT | Freq: Every day | OPHTHALMIC | Status: DC
  Filled 2021-02-09: qty 3.5

## 2021-02-09 MED ORDER — DEXAMETHASONE SODIUM PHOSPHATE 10 MG/ML IJ SOLN
INTRAMUSCULAR | Status: AC
  Filled 2021-02-09: qty 1

## 2021-02-09 MED ORDER — LIDOCAINE 2% (20 MG/ML) 5 ML SYRINGE
INTRAMUSCULAR | Status: AC
  Filled 2021-02-09: qty 5

## 2021-02-09 MED ORDER — 0.9 % SODIUM CHLORIDE (POUR BTL) OPTIME
TOPICAL | Status: DC | PRN
  Administered 2021-02-09: 1000 mL

## 2021-02-09 MED ORDER — POVIDONE-IODINE 10 % EX SWAB
2.0000 "application " | Freq: Once | CUTANEOUS | Status: AC
  Administered 2021-02-10: 2 via TOPICAL

## 2021-02-09 MED ORDER — BACITRACIN ZINC 500 UNIT/GM EX OINT
TOPICAL_OINTMENT | Freq: Every day | CUTANEOUS | Status: DC
  Administered 2021-02-15: 1 via TOPICAL
  Filled 2021-02-09: qty 28.4

## 2021-02-09 MED ORDER — METHOCARBAMOL 500 MG PO TABS
500.0000 mg | ORAL_TABLET | Freq: Four times a day (QID) | ORAL | Status: DC
  Administered 2021-02-09 – 2021-02-13 (×10): 500 mg via ORAL
  Filled 2021-02-09 (×10): qty 1

## 2021-02-09 MED ORDER — HYDROMORPHONE HCL 1 MG/ML IJ SOLN
INTRAMUSCULAR | Status: AC
  Administered 2021-02-09: 1 mg via INTRAVENOUS
  Filled 2021-02-09: qty 1

## 2021-02-09 SURGICAL SUPPLY — 50 items
APL SKNCLS STERI-STRIP NONHPOA (GAUZE/BANDAGES/DRESSINGS) ×1
BENZOIN TINCTURE PRP APPL 2/3 (GAUZE/BANDAGES/DRESSINGS) ×1 IMPLANT
BLADE SURG 15 STRL LF DISP TIS (BLADE) IMPLANT
BLADE SURG 15 STRL SS (BLADE)
CANISTER SUCT 3000ML PPV (MISCELLANEOUS) ×2 IMPLANT
CLEANER TIP ELECTROSURG 2X2 (MISCELLANEOUS) ×2 IMPLANT
CORD BIPOLAR FORCEPS 12FT (ELECTRODE) IMPLANT
COVER SURGICAL LIGHT HANDLE (MISCELLANEOUS) ×2 IMPLANT
COVER WAND RF STERILE (DRAPES) ×2 IMPLANT
DRAIN JP 7F FLT 3/4 PRF SI HBL (DRAIN) ×1 IMPLANT
DRAPE HALF SHEET 40X57 (DRAPES) IMPLANT
ELECT COATED BLADE 2.86 ST (ELECTRODE) ×2 IMPLANT
ELECT NDL TIP 2.8 STRL (NEEDLE) IMPLANT
ELECT NEEDLE TIP 2.8 STRL (NEEDLE) IMPLANT
ELECT REM PT RETURN 9FT ADLT (ELECTROSURGICAL) ×2
ELECTRODE REM PT RTRN 9FT ADLT (ELECTROSURGICAL) ×1 IMPLANT
EVACUATOR SILICONE 100CC (DRAIN) ×1 IMPLANT
GLOVE BIO SURGEON STRL SZ7 (GLOVE) ×2 IMPLANT
GOWN STRL REUS W/ TWL LRG LVL3 (GOWN DISPOSABLE) ×2 IMPLANT
GOWN STRL REUS W/TWL LRG LVL3 (GOWN DISPOSABLE) ×4
KIT BASIN OR (CUSTOM PROCEDURE TRAY) ×2 IMPLANT
KIT TURNOVER KIT B (KITS) ×2 IMPLANT
LOCATOR NERVE 3 VOLT (DISPOSABLE) IMPLANT
NDL HYPO 25GX1X1/2 BEV (NEEDLE) IMPLANT
NEEDLE HYPO 25GX1X1/2 BEV (NEEDLE) IMPLANT
NS IRRIG 1000ML POUR BTL (IV SOLUTION) ×2 IMPLANT
PAD ARMBOARD 7.5X6 YLW CONV (MISCELLANEOUS) ×4 IMPLANT
PENCIL SMOKE EVACUATOR (MISCELLANEOUS) ×2 IMPLANT
SET INTBT LACRIMAL .016X.025 (DRAIN) ×1 IMPLANT
SPONGE LAP 4X18 RFD (DISPOSABLE) ×2 IMPLANT
STAPLER VISISTAT 35W (STAPLE) ×1 IMPLANT
STRIP CLOSURE SKIN 1/2X4 (GAUZE/BANDAGES/DRESSINGS) ×1 IMPLANT
SUT CHROMIC 4 0 P 3 18 (SUTURE) IMPLANT
SUT ETHILON 3 0 PS 1 (SUTURE) IMPLANT
SUT ETHILON 5 0 P 3 18 (SUTURE)
SUT ETHILON 5 0 PS 2 18 (SUTURE) IMPLANT
SUT NYLON ETHILON 5-0 P-3 1X18 (SUTURE) IMPLANT
SUT PLAIN 5 0 P 3 18 (SUTURE) IMPLANT
SUT PROLENE 4 0 SH DA (SUTURE) ×1 IMPLANT
SUT SILK 2 0 PERMA HAND 18 BK (SUTURE) IMPLANT
SUT SILK 3 0 REEL (SUTURE) IMPLANT
SUT VIC AB 3-0 PS2 18 (SUTURE)
SUT VIC AB 3-0 PS2 18XBRD (SUTURE) IMPLANT
SUT VIC AB 3-0 SH 27 (SUTURE) ×2
SUT VIC AB 3-0 SH 27X BRD (SUTURE) IMPLANT
SUT VIC AB 4-0 P-3 18X BRD (SUTURE) IMPLANT
SUT VIC AB 4-0 P3 18 (SUTURE)
TOWEL GREEN STERILE FF (TOWEL DISPOSABLE) ×2 IMPLANT
TRAY ENT MC OR (CUSTOM PROCEDURE TRAY) ×2 IMPLANT
WATER STERILE IRR 1000ML POUR (IV SOLUTION) ×2 IMPLANT
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
## 2021-02-09 NOTE — Progress Notes (Signed)
Patient received from PACU s/p bilateral scalp laceration repair w/JP drain to suction in right scalp, sanguinous drainage around right eye and right scalp. Vital signs stable. Patient c/o 10/10 pain, administered Dilaudid and IVF started. Bed in low position, call light within reached. MRSA swab collected and sent to lab. Belongings, pants/shirt/shoes, at bedside. Patient called spouse to notify of admission.

## 2021-02-09 NOTE — Transfer of Care (Signed)
Immediate Anesthesia Transfer of Care Note  Patient: [name]  Procedure(s) Performed: FACIAL LACERATION REPAIR (N/A ) CLOSURE OF FACIAL LACERATION (N/A Face)  Patient Location: PACU  Anesthesia Type:General  Level of Consciousness: drowsy  Airway & Oxygen Therapy: Patient Spontanous Breathing and Patient connected to nasal cannula oxygen  Post-op Assessment: Report given to RN and Post -op Vital signs reviewed and stable  Post vital signs: Reviewed and stable  Last Vitals:  Vitals Value Taken Time  BP 150/94 02/09/21 0514  Temp    Pulse 90 02/09/21 0520  Resp 19 02/09/21 0520  SpO2 100 % 02/09/21 0520  Vitals shown include unvalidated device data.  Last Pain:  Vitals:   02/09/21 0232  TempSrc:   PainSc: 0-No pain         Complications: No complications documented.

## 2021-02-09 NOTE — Anesthesia Postprocedure Evaluation (Signed)
Anesthesia Post Note  Patient: [name]  Procedure(s) Performed: FACIAL LACERATION REPAIR (N/A ) CLOSURE OF FACIAL LACERATION (N/A Face)     Patient location during evaluation: PACU Anesthesia Type: General Level of consciousness: awake and alert Pain management: pain level controlled Vital Signs Assessment: post-procedure vital signs reviewed and stable Respiratory status: spontaneous breathing, nonlabored ventilation, respiratory function stable and patient connected to nasal cannula oxygen Cardiovascular status: stable and blood pressure returned to baseline Anesthetic complications: no   No complications documented.  Last Vitals:  Vitals:   02/09/21 0600 02/09/21 0615  BP: (!) 159/95 (!) 154/90  Pulse: 87 83  Resp: 15 14  Temp:  36.7 C  SpO2: 93% 93%    Last Pain:  Vitals:   02/09/21 0600  TempSrc:   PainSc: Asleep                 Beryle Lathe

## 2021-02-09 NOTE — Anesthesia Procedure Notes (Addendum)
Procedure Name: Intubation Date/Time: 02/09/2021 3:51 AM Performed by: Chrislynn Mosely T, CRNA Pre-anesthesia Checklist: Patient identified, Emergency Drugs available, Suction available and Patient being monitored Patient Re-evaluated:Patient Re-evaluated prior to induction Oxygen Delivery Method: Circle system utilized Preoxygenation: Pre-oxygenation with 100% oxygen Induction Type: IV induction and Rapid sequence Ventilation: Mask ventilation without difficulty and Oral airway inserted - appropriate to patient size Laryngoscope Size: Glidescope and 4 Tube type: Oral Tube size: 7.5 mm Number of attempts: 1 Airway Equipment and Method: Stylet,  Oral airway and Video-laryngoscopy Placement Confirmation: ETT inserted through vocal cords under direct vision,  positive ETCO2 and breath sounds checked- equal and bilateral Secured at: 22 cm Tube secured with: Tape Dental Injury: Teeth and Oropharynx as per pre-operative assessment
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
## 2021-02-09 NOTE — Progress Notes (Signed)
Orthopedic Tech Progress Note Patient Details:  [name] [birth year] [id number]  Ortho Devices Type of Ortho Device: Shoulder immobilizer,Coapt Ortho Device/Splint Location: LUE Ortho Device/Splint Interventions: Ordered,Application   Post Interventions Patient Tolerated: Fair Instructions Provided: Adjustment of device   [employer] 02/09/2021, 1:20 AM

## 2021-02-09 NOTE — Op Note (Signed)
Procedure(s): CLOSURE OF FACIAL LACERATION Procedure Note  [name] male 55 y.o. 02/09/2021  Procedure(s) and Anesthesia Type:    * CLOSURE OF FACIAL LACERATION - General  Surgeon(s) and Role:    * Rejeana Brock, MD - Primary   Indications: I was consulted for management of the extensive complex facial lacerations.  Discussions were had between myself, the trauma surgeon, and the ophthalmologist about how best to take care of this patient's injuries.  Ultimately, ophthalmology felt that delayed repair of the lacrimal injury was best and asked that I move forward with closure of the wound.     Surgeon: Rejeana Brock   Assistants: none  Anesthesia: General endotracheal anesthesia  ASA Class: none    Procedure Detail  CLOSURE OF FACIAL LACERATION  Patient was put sleep and intubated without difficulty.  His face and scalp were prepped and draped in sterile fashion using Betadine solution.  Old blood was removed and the wound irrigated with copious amounts of sterile saline.  Multiple staples have been placed in the trauma bay by the trauma surgeon to try and control hemorrhage.  These were removed and the wound irrigated.  Staples were then used to close the scalp wounds each measuring approximately 20 cm in length.  The right scalp wound was placed a JP drain to bulb suction.  Examination of the facial laceration confirmed the diagnosis of complete avulsion of the upper and lower tear duct canaliculi.  This was confirmed using lacrimal probes.  This was the expected finding based on ophthalmology's evaluation preoperatively.  The wound was irrigated and brought together using deep 4-0 Vicryl sutures and 4-0 Prolene to close the skin.  This was done in an interrupted fashion.  At the end of the case Betadine was cleaned free the skin.  Antibiotic ointment was placed into the right eye.  Steri-Strips were used to close the right eye.  Findings: 30 cm scalp and facial  laceration  Estimated Blood Loss:  less than 50 mL         Drains: JACKSON-PRATT (JP)               Specimens: none              Complications:  * No complications entered in OR log *         Disposition: PACU - hemodynamically stable.         Condition: stable

## 2021-02-09 NOTE — Progress Notes (Signed)
Patient ID: [name], male   DOB: 1966-06-01, 55 y.o.   MRN: [id number]  [name] reports that overall he is feeling better.  His pain is well controlled.  His eye has been steri-stripped closed during the day.  No new eye complaints.    Exam:   Cc OTC rdrs:  OD; 20/100 OS 20/25  Pupils:  OD: fixed, dilated  OS miotic, minimal reaction   IOP T pen OD 21.   OD bedside lighted exam:  External: brow lac stapled; drain in place Lids: (steris removed)  Interrupted prolenes in place; 2+ edema; medial canthus has been reapproximated.  Complete ptosis; no appreciable levator function Conj: SCH Cornea: ung in tear film AC: formed Iris: dilated, intact Lens: appears clear  20D exam:  Vit: clear; no VH; retina: scatterred areas of commotio; no heme; retina attached.   [name] is a 55 yo M with:   -- Closed brow lac -- Reapproximated and closed medial canthal lid lac OD -- Upper and lower canalicular laceration OD.   -- Corneal abrasion OD -- Commotio retinae OD -- Probable traumatic mydriasis OD -- Possible traumatic ptosis OD.    I continue to agree with the decision to first address the patient's more emergent co-existant injuries here at [facility] overnight.  Ultimately he will need the canalicular injuries repaired.  I've contacted [facility] service today.  I spoke to their resident Dr. Melene Muller who has communicated with Dr. Dimple Casey (oculoplastics attending).  They agree to see the patient and planned canalicular repair.  Would recommend outpatient eval at [facility] as soon as feasible following D/C from the hospital.     ** If possible, please contact me (Dr. Randon Goldsmith) 1-2 days prior to anticipated discharge so that I can arrange appointment date and time with Dr. Dimple Casey.   I'm available at [phone]  In the interim will back down lacrilube to QHS OD and baci oph ung BID OD.    I will remain available and continue to follow.

## 2021-02-09 NOTE — ED Notes (Signed)
Dr. Eulah Pont reduced patient's right hip dislocation ( 2nd attempt)  with Dr. Nicanor Alcon .

## 2021-02-09 NOTE — Anesthesia Preprocedure Evaluation (Addendum)
Anesthesia Evaluation  Patient identified by MRN, date of birth, ID band Patient awake    Reviewed: Allergy & Precautions, NPO status , Patient's Chart, lab work & pertinent test results  History of Anesthesia Complications Negative for: history of anesthetic complications  Airway Mallampati: III  TM Distance: >3 FB Neck ROM: Limited    Dental  (+) Dental Advisory Given   Pulmonary neg pulmonary ROS,    Pulmonary exam normal        Cardiovascular negative cardio ROS Normal cardiovascular exam     Neuro/Psych  C-spine not cleared negative psych ROS   GI/Hepatic negative GI ROS, Neg liver ROS,   Endo/Other  Hypothyroidism  Obesity   Renal/GU Renal InsufficiencyRenal disease     Musculoskeletal negative musculoskeletal ROS (+)   Abdominal   Peds  Hematology  (+) anemia ,   Anesthesia Other Findings Left humerus fx Right hip fx/dislocation Facial/scalp laceration Right eye injury Covid test negative   Reproductive/Obstetrics                            Anesthesia Physical Anesthesia Plan  ASA: II and emergent  Anesthesia Plan: General   Post-op Pain Management:    Induction: Intravenous  PONV Risk Score and Plan: 2 and Treatment may vary due to age or medical condition, Ondansetron, Dexamethasone and Midazolam  Airway Management Planned: Oral ETT and Video Laryngoscope Planned  Additional Equipment: None  Intra-op Plan:   Post-operative Plan: Extubation in OR  Informed Consent:   Plan Discussed with: CRNA and Anesthesiologist  Anesthesia Plan Comments:        Anesthesia Quick Evaluation

## 2021-02-09 NOTE — ED Notes (Signed)
Patient signed consent forms for procedural sedation and reduction of right hip dislocation .

## 2021-02-09 NOTE — ED Provider Notes (Addendum)
  Physical Exam  BP 111/78 (BP Location: Right Arm)   Pulse 100   Temp 97.6 F (36.4 C) (Temporal)   Resp 18   Ht 5\' 10"  (1.778 m)   Wt 100 kg   SpO2 100%   BMI 31.63 kg/m   Physical Exam  ED Course/Procedures     .Sedation  Date/Time: 02/09/2021 1:15 AM Performed by: 02/11/2021, MD Authorized by: Cy Blamer, MD   Consent:    Consent obtained:  Written   Consent given by:  Patient   Risks discussed:  Allergic reaction, dysrhythmia, inadequate sedation, nausea, prolonged hypoxia resulting in organ damage, prolonged sedation necessitating reversal, respiratory compromise necessitating ventilatory assistance and intubation and vomiting   Alternatives discussed:  Analgesia without sedation Universal protocol:    Procedure explained and questions answered to patient or proxy's satisfaction: yes     Imaging studies available: yes     Immediately prior to procedure, a time out was called: yes   Indications:    Procedure performed:  Dislocation reduction   Procedure necessitating sedation performed by:  Different physician Pre-sedation assessment:    Time since last food or drink:  6-7 hours    ASA classification: class 1 - normal, healthy patient     Mouth opening:  3 or more finger widths   Mallampati score:  II - soft palate, uvula, fauces visible   Neck mobility: reduced     Pre-sedation assessments completed and reviewed: pre-procedure airway patency not reviewed, pre-procedure cardiovascular function not reviewed, pre-procedure hydration status not reviewed, pre-procedure mental status not reviewed, pre-procedure nausea and vomiting status not reviewed and pre-procedure pain level not reviewed     Pre-sedation assessment completed:  02/09/2021 1:01 AM Immediate pre-procedure details:    Reassessment: Patient reassessed immediately prior to procedure     Reviewed: vital signs, relevant labs/tests and NPO status     Verified: bag valve mask available and emergency  equipment available   Procedure details (see MAR for exact dosages):    Preoxygenation:  Nasal cannula   Sedation:  Propofol   Intended level of sedation: moderate (conscious sedation)   Intra-procedure monitoring:  Blood pressure monitoring, cardiac monitor, continuous capnometry and continuous pulse oximetry   Intra-procedure events: none     Total Provider sedation time (minutes):  15 Post-procedure details:    Post-sedation assessment completed:  02/09/2021 1:17 AM   Attendance: Constant attendance by certified staff until patient recovered     Recovery: Patient returned to pre-procedure baseline     Post-sedation assessments completed and reviewed: post-procedure airway patency not reviewed, post-procedure cardiovascular function not reviewed, post-procedure mental status not reviewed, post-procedure nausea and vomiting status not reviewed and pain score not reviewed     Patient is stable for discharge or admission: yes     Procedure completion:  Tolerated well, no immediate complications    MDM   Dr. 02/11/2021 would like patient transferred to a  A hospital with oculoplastics  218 case d/w Dr. [birth year]20[birth year]1972, patient will not be transferred emergency regarding canalicular injury.  He will be admitted at [facility].         Bernadene Garside, MD 02/09/21 02/11/21    [id number], MD 02/09/21 02/11/21

## 2021-02-09 NOTE — TOC CAGE-AID Note (Signed)
Transition of Care [facility]) - CAGE-AID Screening   Patient Details  Name: Hanish Laraia MRN: [id number] Date of Birth: [birth year]  Transition of Care [facility]) CM/SW Contact:    Katha Hamming, RN Phone Number:[phone] 02/09/2021, 11:04 AM   Clinical Narrative: Arrives after [facility] 5/18 initially level 2 trauma activation upgraded to level 1 d/t hypotension. Humerus fx, right hip fx/dislocation with plans for surgical repair tomorrow.    CAGE-AID Screening:    Have You Ever Felt You Ought to Cut Down on Your Drinking or Drug Use?: No Have People Annoyed You By Critizing Your Drinking Or Drug Use?: No Have You Felt Bad Or Guilty About Your Drinking Or Drug Use?: No Have You Ever Had a Drink or Used Drugs First Thing In The Morning to Steady Your Nerves or to Get Rid of a Hangover?: No CAGE-AID Score: 0  Substance Abuse Education Offered: Yes (reports intermittent alcohol use and daily smoking, denies substance use. declines resources at this time.)  Substance abuse interventions: Other (must comment) (pt declined resources)

## 2021-02-09 NOTE — H&P (Addendum)
Subjective:     [name] is a 55 y.o. male trauma patient that I was called about 35 minutes ago by Dr. Derrell Lolling of the trauma team requesting assistance in closure of his complex and extensive facial and scalp lacerations.  Patient has undergone a CT maxillofacial scan.  Patient complains of blurred vision in his right eye. Dr. Ranae Pila of ophthalmology has been called by the ER attending and is coming in tonight to see the patient.   Review of Systems Pertinent items are noted in HPI.    No FH on file  No pertinent surgical history  Medications - unknown  PMH - unknown  NKDA   Objective:    BP 136/86   Pulse 90   Temp (!) 97.4 F (36.3 C) (Temporal)   Resp 14   Ht 5\' 10"  (1.778 m)   Wt 100 kg   SpO2 100%   BMI 31.63 kg/m    Patient is appropriate and responds to questions/no respiratory distress Facial laceration noted below extending adjacent to but not involving the medial canthus of the right eye/multiple staples have been placed by another provider prior to my arrival in an effort to control bleeding Cranial nerves appear intact with the exception of vision in the right eye which appears to be suboptimal.  Patient describes it as blurred and he has difficulty moving the eye. Facial bones are stable without step-offs or tenderness No malocclusion Pinna normal without mastoid tenderness or ecchymosis Neck without air or contusion/midline Chest sym expansions bilaterally without use of accessory muscles     CT scan maxillofacial -  I reviewed the films with the radiologist myself.  There are no bony fractures seen on the maxillofacial CT scan.   Assessment:  1.  Large laceration involving face and scalp  2.  Decreased ocular movement and visual acuity on the right side   Patient has other orthopedic injuries for which she needs to go to the operating room.  Given the degree of bleeding described prior to placement of the staples and the need to thoroughly  clean the wound, closure under general anesthesia is indicated.  There are no medical contraindications to general anesthesia per the trauma service and ER attending.  We will move ahead with definitive repair tonight.  This patient needs to be seen by ophthalmology due to the decreased visual acuity involving the right eye.  I have discussed this with Dr. and Dr. Denton Lank of the ER.  They have called ophtho (Dr. Daun Peacock) who is coming in now to evaluate the patient.   Addendum:  02/09/21 at 0212  Ophtho evaluated the patient in the ER trauma bay due to concerns about visual acuity and possible disruption of the lacrimal system of the right eye.  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  This is outside of Dr. 0213 scope of practice and requires an oculoplastics specialist.  He discussed with Dr. Ranae Pila of the trauma service and is recommending that I move forward with definitive closure of the laceration with delayed oculoplastics repair of the lacrimal injury.  Dr. Derrell Lolling does not feel that transfer to another facility is possible due to the patient's other injuries.    Addendum:  02/09/21 at 0300   I called and spoke to Dr. 02/11/21.  He spoke with ophthamology directly and confirms that their recommendation is for delayed repair of the lacrimal apparatus with primary closure of the  laceration by me tonight.  We will go to the OR now for closure of the scalp and facial laceration.
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
## 2021-02-09 NOTE — ED Notes (Signed)
Patient transported to OR , report given to nurse anesthetist.
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
## 2021-02-09 NOTE — Plan of Care (Signed)

## 2021-02-09 NOTE — Procedures (Signed)
Procedure: Right tibial traction pin placement  Indication: Right acetabulum fracture  Surgeon: Charma Igo, PA-C  Assist: None  Anesthesia: 12ml 1% lidocaine with epi  EBL: None  Complications: None  Findings: After risks/benefits explained patient desires to undergo procedure. Consent obtained and time out performed. 20ml lidocaine was infiltrated in the subcutaneous tissues and periosteum on both sides. The right lateral and medial tibia were sterilely prepped, marked, and draped. A 2.0 pin was placed 2cm posterior to tibia tubercle and continued through to the other side. Pt tolerated the procedure well.    Freeman Caldron, PA-C Orthopedic Surgery [phone]

## 2021-02-10 ENCOUNTER — Inpatient Hospital Stay (HOSPITAL_COMMUNITY): Payer: Self-pay

## 2021-02-10 ENCOUNTER — Inpatient Hospital Stay (HOSPITAL_COMMUNITY): Payer: Self-pay | Admitting: Certified Registered"

## 2021-02-10 ENCOUNTER — Encounter (HOSPITAL_COMMUNITY): Admission: EM | Disposition: A | Discharge status: Rehabilitation Facility | Payer: Self-pay | Source: Home / Self Care

## 2021-02-10 ENCOUNTER — Encounter (HOSPITAL_COMMUNITY): Payer: Self-pay | Admitting: Otolaryngology

## 2021-02-10 DIAGNOSIS — S73004A Unspecified dislocation of right hip, initial encounter: Secondary | ICD-10-CM | POA: Insufficient documentation

## 2021-02-10 DIAGNOSIS — S73005A Unspecified dislocation of left hip, initial encounter: Secondary | ICD-10-CM

## 2021-02-10 DIAGNOSIS — S32442A Displaced fracture of posterior column [ilioischial] of left acetabulum, initial encounter for closed fracture: Secondary | ICD-10-CM

## 2021-02-10 DIAGNOSIS — S32441A Displaced fracture of posterior column [ilioischial] of right acetabulum, initial encounter for closed fracture: Secondary | ICD-10-CM | POA: Insufficient documentation

## 2021-02-10 DIAGNOSIS — S42322A Displaced transverse fracture of shaft of humerus, left arm, initial encounter for closed fracture: Secondary | ICD-10-CM | POA: Insufficient documentation

## 2021-02-10 DIAGNOSIS — S42321A Displaced transverse fracture of shaft of humerus, right arm, initial encounter for closed fracture: Secondary | ICD-10-CM

## 2021-02-10 HISTORY — PX: ORIF ACETABULAR FRACTURE: SHX5029

## 2021-02-10 HISTORY — PX: ORIF HUMERUS FRACTURE: SHX2126

## 2021-02-10 LAB — BASIC METABOLIC PANEL
Anion gap: 5 (ref 5–15)
BUN: 11 mg/dL (ref 6–20)
CO2: 25 mmol/L (ref 22–32)
Calcium: 8 mg/dL — ABNORMAL LOW (ref 8.9–10.3)
Chloride: 105 mmol/L (ref 98–111)
Creatinine, Ser: 1.19 mg/dL (ref 0.61–1.24)
GFR, Estimated: >60 mL/min (ref >60)
Glucose, Bld: 125 mg/dL — ABNORMAL HIGH (ref 70–99)
Potassium: 3.3 mmol/L — ABNORMAL LOW (ref 3.5–5.1)
Sodium: 135 mmol/L (ref 135–145)

## 2021-02-10 LAB — POCT I-STAT EG7
Acid-Base Excess: 0 mmol/L (ref 0.0–2.0)
Bicarbonate: 26.5 mmol/L (ref 20.0–28.0)
Calcium, Ion: 1.19 mmol/L (ref 1.15–1.40)
HCT: 23 % — ABNORMAL LOW (ref 39.0–52.0)
Hemoglobin: 7.8 g/dL — ABNORMAL LOW (ref 13.0–17.0)
O2 Saturation: 31 %
Potassium: 3.8 mmol/L (ref 3.5–5.1)
Sodium: 139 mmol/L (ref 135–145)
TCO2: 28 mmol/L (ref 22–32)
pCO2, Ven: 51.7 mmHg (ref 44.0–60.0)
pH, Ven: 7.318 (ref 7.250–7.430)
pO2, Ven: 22 mmHg — CL (ref 32.0–45.0)

## 2021-02-10 LAB — PREPARE RBC (CROSSMATCH)

## 2021-02-10 LAB — CBC
HCT: 27.7 % — ABNORMAL LOW (ref 39.0–52.0)
Hemoglobin: 9.2 g/dL — ABNORMAL LOW (ref 13.0–17.0)
MCH: 30.9 pg (ref 26.0–34.0)
MCHC: 33.2 g/dL (ref 30.0–36.0)
MCV: 93 fL (ref 80.0–100.0)
Platelets: 160 10*3/uL (ref 150–400)
RBC: 2.98 MIL/uL — ABNORMAL LOW (ref 4.22–5.81)
RDW: 13.7 % (ref 11.5–15.5)
WBC: 10.8 10*3/uL — ABNORMAL HIGH (ref 4.0–10.5)
nRBC: 0 % (ref 0.0–0.2)

## 2021-02-10 LAB — BLOOD PRODUCT ORDER (VERBAL) VERIFICATION

## 2021-02-10 SURGERY — OPEN REDUCTION INTERNAL FIXATION (ORIF) HUMERAL SHAFT FRACTURE
Anesthesia: General | Site: Shoulder | Laterality: Right

## 2021-02-10 MED ORDER — VANCOMYCIN HCL 1000 MG IV SOLR
INTRAVENOUS | Status: AC
  Filled 2021-02-10: qty 2000

## 2021-02-10 MED ORDER — TRANEXAMIC ACID-NACL 1000-0.7 MG/100ML-% IV SOLN
INTRAVENOUS | Status: DC | PRN
  Administered 2021-02-10: 1000 mg via INTRAVENOUS

## 2021-02-10 MED ORDER — POTASSIUM CHLORIDE 10 MEQ/100ML IV SOLN
10.0000 meq | INTRAVENOUS | Status: AC
Start: 2021-02-10 — End: 2021-02-10
  Administered 2021-02-10 (×2): 10 meq via INTRAVENOUS
  Filled 2021-02-10 (×2): qty 100

## 2021-02-10 MED ORDER — LACTATED RINGERS IV SOLN: INTRAVENOUS | Status: DC

## 2021-02-10 MED ORDER — TOBRAMYCIN SULFATE 1.2 G IJ SOLR
INTRAMUSCULAR | Status: AC
  Filled 2021-02-10: qty 2.4

## 2021-02-10 MED ORDER — SODIUM CHLORIDE 0.9% IV SOLUTION: Freq: Once | INTRAVENOUS | Status: DC

## 2021-02-10 MED ORDER — ENOXAPARIN SODIUM 40 MG/0.4ML IJ SOSY
40.0000 mg | PREFILLED_SYRINGE | INTRAMUSCULAR | Status: DC
  Administered 2021-02-11 – 2021-02-15 (×5): 40 mg via SUBCUTANEOUS
  Filled 2021-02-10 (×5): qty 0.4

## 2021-02-10 MED ORDER — ONDANSETRON HCL 4 MG/2ML IJ SOLN: 4.0000 mg | Freq: Four times a day (QID) | INTRAMUSCULAR | Status: DC | PRN

## 2021-02-10 MED ORDER — METHOCARBAMOL 500 MG PO TABS: 500.0000 mg | ORAL_TABLET | Freq: Four times a day (QID) | ORAL | Status: DC | PRN

## 2021-02-10 MED ORDER — ALBUMIN HUMAN 5 % IV SOLN: INTRAVENOUS | Status: DC | PRN

## 2021-02-10 MED ORDER — OXYCODONE HCL 5 MG PO TABS
10.0000 mg | ORAL_TABLET | ORAL | Status: DC | PRN
  Administered 2021-02-10: 10 mg via ORAL
  Administered 2021-02-11 (×2): 15 mg via ORAL
  Administered 2021-02-11: 10 mg via ORAL
  Administered 2021-02-11 – 2021-02-12 (×2): 15 mg via ORAL
  Filled 2021-02-10: qty 2
  Filled 2021-02-10 (×4): qty 3

## 2021-02-10 MED ORDER — PHENYLEPHRINE 40 MCG/ML (10ML) SYRINGE FOR IV PUSH (FOR BLOOD PRESSURE SUPPORT)
PREFILLED_SYRINGE | INTRAVENOUS | Status: DC | PRN
Start: 2021-02-10 — End: 2021-02-10
  Administered 2021-02-10: 200 ug via INTRAVENOUS

## 2021-02-10 MED ORDER — ROCURONIUM BROMIDE 10 MG/ML (PF) SYRINGE
PREFILLED_SYRINGE | INTRAVENOUS | Status: AC
  Filled 2021-02-10: qty 40

## 2021-02-10 MED ORDER — ONDANSETRON HCL 4 MG/2ML IJ SOLN
INTRAMUSCULAR | Status: DC | PRN
  Administered 2021-02-10: 4 mg via INTRAVENOUS

## 2021-02-10 MED ORDER — PHENYLEPHRINE HCL-NACL 10-0.9 MG/250ML-% IV SOLN
INTRAVENOUS | Status: DC | PRN
  Administered 2021-02-10: 25 ug/min via INTRAVENOUS

## 2021-02-10 MED ORDER — HYDROMORPHONE HCL 1 MG/ML IJ SOLN
0.5000 mg | INTRAMUSCULAR | Status: DC | PRN
  Administered 2021-02-10 – 2021-02-13 (×5): 1 mg via INTRAVENOUS
  Filled 2021-02-10 (×4): qty 1

## 2021-02-10 MED ORDER — CHLORHEXIDINE GLUCONATE 0.12 % MT SOLN: 15.0000 mL | Freq: Once | OROMUCOSAL | Status: AC

## 2021-02-10 MED ORDER — LIDOCAINE HCL (CARDIAC) PF 100 MG/5ML IV SOSY
PREFILLED_SYRINGE | INTRAVENOUS | Status: DC | PRN
  Administered 2021-02-10: 100 mg via INTRAVENOUS

## 2021-02-10 MED ORDER — TRANEXAMIC ACID-NACL 1000-0.7 MG/100ML-% IV SOLN
1000.0000 mg | Freq: Once | INTRAVENOUS | Status: AC
  Administered 2021-02-10: 1000 mg via INTRAVENOUS
  Filled 2021-02-10: qty 100

## 2021-02-10 MED ORDER — HYDROMORPHONE HCL 1 MG/ML IJ SOLN
INTRAMUSCULAR | Status: AC
  Filled 2021-02-10: qty 1

## 2021-02-10 MED ORDER — TOBRAMYCIN SULFATE 1.2 G IJ SOLR
INTRAMUSCULAR | Status: DC | PRN
  Administered 2021-02-10: 1.2 g

## 2021-02-10 MED ORDER — MIDAZOLAM HCL 2 MG/2ML IJ SOLN
INTRAMUSCULAR | Status: AC
  Filled 2021-02-10: qty 2

## 2021-02-10 MED ORDER — VANCOMYCIN HCL 1 G IV SOLR
INTRAVENOUS | Status: DC | PRN
  Administered 2021-02-10: 2000 mg

## 2021-02-10 MED ORDER — SUGAMMADEX SODIUM 200 MG/2ML IV SOLN
INTRAVENOUS | Status: DC | PRN
  Administered 2021-02-10: 200 mg via INTRAVENOUS

## 2021-02-10 MED ORDER — PROPOFOL 10 MG/ML IV BOLUS
INTRAVENOUS | Status: DC | PRN
  Administered 2021-02-10: 200 mg via INTRAVENOUS

## 2021-02-10 MED ORDER — METOCLOPRAMIDE HCL 10 MG PO TABS
5.0000 mg | ORAL_TABLET | Freq: Three times a day (TID) | ORAL | Status: DC | PRN
Start: 2021-02-10 — End: 2021-02-13

## 2021-02-10 MED ORDER — SUFENTANIL CITRATE 50 MCG/ML IV SOLN
INTRAVENOUS | Status: DC | PRN
  Administered 2021-02-10 (×3): 10 ug via INTRAVENOUS

## 2021-02-10 MED ORDER — OXYCODONE HCL 5 MG PO TABS: 5.0000 mg | ORAL_TABLET | Freq: Once | ORAL | Status: DC | PRN

## 2021-02-10 MED ORDER — METHOCARBAMOL 1000 MG/10ML IJ SOLN
500.0000 mg | Freq: Four times a day (QID) | INTRAVENOUS | Status: DC | PRN
  Filled 2021-02-10: qty 5

## 2021-02-10 MED ORDER — METOCLOPRAMIDE HCL 5 MG/ML IJ SOLN: 5.0000 mg | Freq: Three times a day (TID) | INTRAMUSCULAR | Status: DC | PRN

## 2021-02-10 MED ORDER — HYDROMORPHONE HCL 1 MG/ML IJ SOLN
0.2500 mg | INTRAMUSCULAR | Status: DC | PRN
  Administered 2021-02-10 (×4): 0.5 mg via INTRAVENOUS

## 2021-02-10 MED ORDER — POLYETHYLENE GLYCOL 3350 17 G PO PACK: 17.0000 g | PACK | Freq: Every day | ORAL | Status: DC | PRN

## 2021-02-10 MED ORDER — SUFENTANIL CITRATE 50 MCG/ML IV SOLN
INTRAVENOUS | Status: AC
  Filled 2021-02-10: qty 1

## 2021-02-10 MED ORDER — ORAL CARE MOUTH RINSE: 15.0000 mL | Freq: Once | OROMUCOSAL | Status: AC

## 2021-02-10 MED ORDER — DEXAMETHASONE SODIUM PHOSPHATE 10 MG/ML IJ SOLN
INTRAMUSCULAR | Status: DC | PRN
  Administered 2021-02-10: 10 mg via INTRAVENOUS

## 2021-02-10 MED ORDER — OXYCODONE HCL 5 MG/5ML PO SOLN: 5.0000 mg | Freq: Once | ORAL | Status: DC | PRN

## 2021-02-10 MED ORDER — 0.9 % SODIUM CHLORIDE (POUR BTL) OPTIME
TOPICAL | Status: DC | PRN
  Administered 2021-02-10: 1000 mL

## 2021-02-10 MED ORDER — LACTATED RINGERS IV SOLN: INTRAVENOUS | Status: DC | PRN

## 2021-02-10 MED ORDER — OXYCODONE HCL 5 MG PO TABS
5.0000 mg | ORAL_TABLET | ORAL | Status: DC | PRN
  Filled 2021-02-10: qty 2

## 2021-02-10 MED ORDER — MIDAZOLAM HCL 2 MG/2ML IJ SOLN
INTRAMUSCULAR | Status: DC | PRN
  Administered 2021-02-10: 2 mg via INTRAVENOUS

## 2021-02-10 MED ORDER — CHLORHEXIDINE GLUCONATE 0.12 % MT SOLN
OROMUCOSAL | Status: AC
  Administered 2021-02-10: 15 mL via OROMUCOSAL
  Filled 2021-02-10: qty 15

## 2021-02-10 MED ORDER — ROCURONIUM 10MG/ML (10ML) SYRINGE FOR MEDFUSION PUMP - OPTIME
INTRAVENOUS | Status: DC | PRN
  Administered 2021-02-10 (×2): 100 mg via INTRAVENOUS

## 2021-02-10 MED ORDER — CEFAZOLIN SODIUM-DEXTROSE 2-4 GM/100ML-% IV SOLN
2.0000 g | Freq: Three times a day (TID) | INTRAVENOUS | Status: AC
  Administered 2021-02-11 (×3): 2 g via INTRAVENOUS
  Filled 2021-02-10 (×4): qty 100

## 2021-02-10 SURGICAL SUPPLY — 98 items
ADH SKN CLS APL DERMABOND .7 (GAUZE/BANDAGES/DRESSINGS) ×6
APL PRP STRL LF DISP 70% ISPRP (MISCELLANEOUS) ×2
BIT DRILL 2.5X300 (BIT) IMPLANT
BIT DRILL FLUTED 2.5 (BIT) IMPLANT
BIT DRILL Q/COUPLING 1 (BIT) ×1 IMPLANT
BIT DRILL STEP 3.5 (DRILL) IMPLANT
BLADE CLIPPER SURG (BLADE) IMPLANT
BNDG COHESIVE 4X5 TAN STRL (GAUZE/BANDAGES/DRESSINGS) ×3 IMPLANT
BNDG ELASTIC 4X5.8 VLCR STR LF (GAUZE/BANDAGES/DRESSINGS) ×2 IMPLANT
BNDG ELASTIC 6X5.8 VLCR STR LF (GAUZE/BANDAGES/DRESSINGS) ×2 IMPLANT
BRUSH SCRUB EZ PLAIN DRY (MISCELLANEOUS) ×6 IMPLANT
CHLORAPREP W/TINT 26 (MISCELLANEOUS) ×3 IMPLANT
COUNTER NEEDLE 20 DBL MAG RED (NEEDLE) ×1 IMPLANT
COVER SURGICAL LIGHT HANDLE (MISCELLANEOUS) ×6 IMPLANT
COVER WAND RF STERILE (DRAPES) ×3 IMPLANT
DERMABOND ADVANCED (GAUZE/BANDAGES/DRESSINGS) ×3
DERMABOND ADVANCED .7 DNX12 (GAUZE/BANDAGES/DRESSINGS) ×4 IMPLANT
DRAPE C-ARM 42X72 X-RAY (DRAPES) ×3 IMPLANT
DRAPE C-ARMOR (DRAPES) ×3 IMPLANT
DRAPE INCISE IOBAN 66X45 STRL (DRAPES) ×3 IMPLANT
DRAPE INCISE IOBAN 85X60 (DRAPES) ×3 IMPLANT
DRAPE ORTHO SPLIT 77X108 STRL (DRAPES) ×9
DRAPE SURG 17X23 STRL (DRAPES) ×3 IMPLANT
DRAPE SURG ORHT 6 SPLT 77X108 (DRAPES) ×4 IMPLANT
DRAPE U-SHAPE 47X51 STRL (DRAPES) ×6 IMPLANT
DRILL BIT 2.5X300 (BIT) ×3
DRILL BIT FLUTED 2.5 (BIT) ×3
DRILL STEP 3.5 (DRILL)
DRSG MEPILEX BORDER 4X12 (GAUZE/BANDAGES/DRESSINGS) ×2 IMPLANT
DRSG MEPILEX BORDER 4X8 (GAUZE/BANDAGES/DRESSINGS) ×3 IMPLANT
DRSG PAD ABDOMINAL 8X10 ST (GAUZE/BANDAGES/DRESSINGS) ×2 IMPLANT
ELECT BLADE 6.5 EXT (BLADE) ×3 IMPLANT
ELECT REM PT RETURN 9FT ADLT (ELECTROSURGICAL) ×3
ELECTRODE REM PT RTRN 9FT ADLT (ELECTROSURGICAL) ×2 IMPLANT
EVACUATOR 1/8 PVC DRAIN (DRAIN) IMPLANT
GLOVE BIO SURGEON STRL SZ 6.5 (GLOVE) ×9 IMPLANT
GLOVE BIO SURGEON STRL SZ7.5 (GLOVE) ×12 IMPLANT
GLOVE BIOGEL PI IND STRL 7.5 (GLOVE) ×2 IMPLANT
GLOVE BIOGEL PI INDICATOR 7.5 (GLOVE) ×1
GLOVE SURG UNDER POLY LF SZ6.5 (GLOVE) ×3 IMPLANT
GOWN STRL REUS W/ TWL LRG LVL3 (GOWN DISPOSABLE) ×4 IMPLANT
GOWN STRL REUS W/TWL LRG LVL3 (GOWN DISPOSABLE) ×6
HANDPIECE INTERPULSE COAX TIP (DISPOSABLE) ×3
KIT BASIN OR (CUSTOM PROCEDURE TRAY) ×3 IMPLANT
KIT TURNOVER KIT B (KITS) ×3 IMPLANT
MANIFOLD NEPTUNE II (INSTRUMENTS) ×4 IMPLANT
NDL HYPO 25X1 1.5 SAFETY (NEEDLE) ×2 IMPLANT
NEEDLE HYPO 25X1 1.5 SAFETY (NEEDLE) ×3 IMPLANT
NS IRRIG 1000ML POUR BTL (IV SOLUTION) ×3 IMPLANT
PACK ORTHO EXTREMITY (CUSTOM PROCEDURE TRAY) ×3 IMPLANT
PACK SHOULDER (CUSTOM PROCEDURE TRAY) ×1 IMPLANT
PACK TOTAL JOINT (CUSTOM PROCEDURE TRAY) ×3 IMPLANT
PAD ARMBOARD 7.5X6 YLW CONV (MISCELLANEOUS) ×6 IMPLANT
PLATE CRVD RECON LP 3.5X108 8H (Plate) ×1 IMPLANT
PLATE LOCK RECON LP 3.5 5H (Plate) ×1 IMPLANT
PLATE LOCKING 8 HOLE (Plate) ×1 IMPLANT
RETRIEVER SUT HEWSON (MISCELLANEOUS) ×3 IMPLANT
SCREW CORTEX 3.5 26MM (Screw) ×2 IMPLANT
SCREW CORTEX 3.5 36MM (Screw) ×3 IMPLANT
SCREW CORTEX 3.5 38MM (Screw) ×1 IMPLANT
SCREW CORTEX 3.5 40MM (Screw) ×1 IMPLANT
SCREW CORTEX 3.5 45MM (Screw) ×3 IMPLANT
SCREW CORTEX ST 4.5X26 (Screw) ×2 IMPLANT
SCREW CORTEX ST 4.5X28 (Screw) ×1 IMPLANT
SCREW CORTEX ST 4.5X30 (Screw) ×2 IMPLANT
SCREW CORTEX ST 4.5X32 (Screw) ×2 IMPLANT
SCREW LOCK CORT ST 3.5X26 (Screw) IMPLANT
SCREW LOCK CORT ST 3.5X36 (Screw) IMPLANT
SCREW LOCK CORT ST 3.5X38 (Screw) IMPLANT
SCREW LOCK CORT ST 3.5X40 (Screw) IMPLANT
SET HNDPC FAN SPRY TIP SCT (DISPOSABLE) ×2 IMPLANT
SLING ARM FOAM STRAP LRG (SOFTGOODS) ×1 IMPLANT
SPONGE LAP 18X18 RF (DISPOSABLE) ×3 IMPLANT
STAPLER VISISTAT 35W (STAPLE) ×3 IMPLANT
SUCTION FRAZIER HANDLE 10FR (MISCELLANEOUS) ×3
SUCTION TUBE FRAZIER 10FR DISP (MISCELLANEOUS) ×2 IMPLANT
SUT ETHILON 2 0 PSLX (SUTURE) ×6 IMPLANT
SUT ETHILON 3 0 PS 1 (SUTURE) ×6 IMPLANT
SUT FIBERWIRE #2 38 T-5 BLUE (SUTURE) ×6
SUT MNCRL AB 3-0 PS2 18 (SUTURE) ×3 IMPLANT
SUT MON AB 2-0 CT1 36 (SUTURE) ×3 IMPLANT
SUT PROLENE 0 CT (SUTURE) IMPLANT
SUT VIC AB 0 CT1 27 (SUTURE) ×6
SUT VIC AB 0 CT1 27XBRD ANBCTR (SUTURE) ×4 IMPLANT
SUT VIC AB 1 CT1 18XCR BRD 8 (SUTURE) ×2 IMPLANT
SUT VIC AB 1 CT1 27 (SUTURE) ×3
SUT VIC AB 1 CT1 27XBRD ANBCTR (SUTURE) ×2 IMPLANT
SUT VIC AB 1 CT1 8-18 (SUTURE) ×3
SUT VIC AB 2-0 CT1 27 (SUTURE) ×9
SUT VIC AB 2-0 CT1 TAPERPNT 27 (SUTURE) ×4 IMPLANT
SUTURE FIBERWR #2 38 T-5 BLUE (SUTURE) ×4 IMPLANT
SYR CONTROL 10ML LL (SYRINGE) ×3 IMPLANT
TOWEL GREEN STERILE (TOWEL DISPOSABLE) ×9 IMPLANT
TOWEL GREEN STERILE FF (TOWEL DISPOSABLE) ×6 IMPLANT
TRAY FOLEY MTR SLVR 16FR STAT (SET/KITS/TRAYS/PACK) IMPLANT
TUBE CONNECTING 12X1/4 (SUCTIONS) ×4 IMPLANT
WATER STERILE IRR 1000ML POUR (IV SOLUTION) ×2 IMPLANT
YANKAUER SUCT BULB TIP NO VENT (SUCTIONS) IMPLANT

## 2021-02-10 NOTE — Progress Notes (Signed)
SLP Cancellation Note  Patient Details Name: [name] MRN: [id number] DOB: 03-30-1966   Cancelled treatment:        Pt having procedure. Will continue efforts   Royce Macadamia 02/10/2021, 4:00 PM   Breck Coons Lonell Face.Ed Nurse, children's [phone] Office [phone]
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
## 2021-02-10 NOTE — Progress Notes (Signed)
Subjective: Pt reports no new difficulty overnight.  Objective: Vital signs in last 24 hours: Temp:  [97.8 F (36.6 C)-99 F (37.2 C)] 98.3 F (36.8 C) (05/20 1145) Pulse Rate:  [88-97] 91 (05/20 1145) Resp:  [13-22] 20 (05/20 1145) BP: (115-170)/(73-91) 170/91 (05/20 1145) SpO2:  [94 %-97 %] 96 % (05/20 1145) Weight:  [100 kg] 100 kg (05/20 1145)  General appearance: alert and cooperative  Head: Scalp laceration healing with staples in place. JP with serosang. drainage. Facial: Laceration closed with sutures. Right periorbital edema. Ears: Examination of the ears shows normal auricles and external auditory canals bilaterally.  Nose: Nasal examination shows normal mucosa, septum, turbinates.  Mouth: Oral cavity examination shows no mucosal lacerations. No significant trismus is noted.  Neck: Palpation of the neck reveals no lymphadenopathy or mass. The trachea is midline.    Recent Labs    02/09/21 0646 02/10/21 0412  WBC 14.2* 10.8*  HGB 11.8* 9.2*  HCT 35.0* 27.7*  PLT 206 160   Recent Labs    02/09/21 0646 02/10/21 0412  NA 138 135  K 4.8 3.3*  CL 107 105  CO2 25 25  GLUCOSE 154* 125*  BUN 12 11  CREATININE 1.45* 1.19  CALCIUM 8.1* 8.0*    Medications:  I have reviewed the patient's current medications. Scheduled: . [MAR Hold] acetaminophen  650 mg Oral Q6H  . [MAR Hold] amoxicillin  500 mg Oral Q8H  . [MAR Hold] artificial tears   Right Eye QHS  . [MAR Hold] bacitracin   Topical Daily  . [MAR Hold] bacitracin-polymyxin b  1 application Right Eye BID  . [MAR Hold] docusate sodium  100 mg Oral BID  . [MAR Hold] methocarbamol  500 mg Oral QID   Continuous: .  ceFAZolin (ANCEF) IV    . dextrose 5 % and 0.9% NaCl 100 mL/hr at 02/10/21 0405  . lactated ringers 10 mL/hr at 02/10/21 1157  . tranexamic acid     PRN:[MAR Hold]  HYDROmorphone (DILAUDID) injection, [MAR Hold] ondansetron **OR** [MAR Hold] ondansetron (ZOFRAN) IV, [MAR Hold]  oxyCODONE  Assessment/Plan: Extensive facial and scalp lacerations, POD#1 s/p repair. - Will remove sutures and staples next week. - Abx coverage for 5 days.   LOS: 2 days   Mihail Prettyman W Jaspal Pultz 02/10/2021, 12:04 PM

## 2021-02-10 NOTE — Anesthesia Preprocedure Evaluation (Signed)
Anesthesia Evaluation  Patient identified by MRN, date of birth, ID band Patient awake    Reviewed: Allergy & Precautions, H&P , NPO status , Patient's Chart, lab work & pertinent test results  Airway Mallampati: II   Neck ROM: full    Dental   Pulmonary neg pulmonary ROS,    breath sounds clear to auscultation       Cardiovascular negative cardio ROS   Rhythm:regular Rate:Normal     Neuro/Psych    GI/Hepatic   Endo/Other    Renal/GU      Musculoskeletal Humeral shaft fx Acetabular fx   Abdominal   Peds  Hematology   Anesthesia Other Findings   Reproductive/Obstetrics                             Anesthesia Physical Anesthesia Plan  ASA: II  Anesthesia Plan: General   Post-op Pain Management:    Induction: Intravenous  PONV Risk Score and Plan: 2 and Ondansetron, Dexamethasone, Midazolam and Treatment may vary due to age or medical condition  Airway Management Planned: Oral ETT  Additional Equipment:   Intra-op Plan:   Post-operative Plan: Extubation in OR  Informed Consent: I have reviewed the patients History and Physical, chart, labs and discussed the procedure including the risks, benefits and alternatives for the proposed anesthesia with the patient or authorized representative who has indicated his/her understanding and acceptance.     Dental advisory given  Plan Discussed with: CRNA, Anesthesiologist and Surgeon  Anesthesia Plan Comments:         Anesthesia Quick Evaluation

## 2021-02-10 NOTE — Progress Notes (Signed)
Dr. Chaney Malling aware pt had vanilla ensure at 0700 this morning.

## 2021-02-10 NOTE — Transfer of Care (Signed)
Immediate Anesthesia Transfer of Care Note  Patient: [name]  Procedure(s) Performed: OPEN REDUCTION INTERNAL FIXATION (ORIF) HUMERAL SHAFT FRACTURE (Left ) OPEN REDUCTION INTERNAL FIXATION (ORIF) ACETABULAR FRACTURE (Right Shoulder)  Patient Location: PACU  Anesthesia Type:General  Level of Consciousness: drowsy  Airway & Oxygen Therapy: Patient Spontanous Breathing and Patient connected to face mask oxygen  Post-op Assessment: Report given to RN and Post -op Vital signs reviewed and stable  Post vital signs: Reviewed and stable  Last Vitals:  Vitals Value Taken Time  BP    Temp    Pulse    Resp    SpO2      Last Pain:  Vitals:   02/10/21 1145  TempSrc: Oral  PainSc:       Patients Stated Pain Goal: 4 (02/09/21 2111)  Complications: No complications documented.
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
## 2021-02-10 NOTE — Progress Notes (Signed)
OT Cancellation Note  Patient Details Name: [name] MRN: [id number] DOB: Jan 27, 1966   Cancelled Treatment:    Reason Eval/Treat Not Completed: Patient not medically ready (pending surgery- order release 5/21)  Wynona Neat, OTR/L  Acute Rehabilitation Services Pager: [phone] Office: [phone] .  02/10/2021, 8:15 AM

## 2021-02-10 NOTE — Progress Notes (Signed)
Pt to OR via bed.

## 2021-02-10 NOTE — Anesthesia Procedure Notes (Signed)
Procedure Name: Intubation Date/Time: 02/10/2021 1:20 PM Performed by: Melina Schools, CRNA Pre-anesthesia Checklist: Patient identified, Emergency Drugs available, Suction available, Patient being monitored and Timeout performed Patient Re-evaluated:Patient Re-evaluated prior to induction Oxygen Delivery Method: Circle system utilized Preoxygenation: Pre-oxygenation with 100% oxygen Induction Type: IV induction and Cricoid Pressure applied Ventilation: Mask ventilation without difficulty Tube type: Oral Tube size: 7.5 mm Number of attempts: 1 Placement Confirmation: ETT inserted through vocal cords under direct vision,  positive ETCO2 and breath sounds checked- equal and bilateral Secured at: 24 cm Tube secured with: Tape Dental Injury: Teeth and Oropharynx as per pre-operative assessment

## 2021-02-10 NOTE — Anesthesia Postprocedure Evaluation (Signed)
Anesthesia Post Note  Patient: [name]  Procedure(s) Performed: OPEN REDUCTION INTERNAL FIXATION (ORIF) HUMERAL SHAFT FRACTURE (Left ) OPEN REDUCTION INTERNAL FIXATION (ORIF) ACETABULAR FRACTURE (Right Shoulder)     Patient location during evaluation: PACU Anesthesia Type: General Level of consciousness: awake and alert, patient cooperative and oriented Pain management: pain level controlled Vital Signs Assessment: post-procedure vital signs reviewed and stable Respiratory status: spontaneous breathing, nonlabored ventilation, respiratory function stable and patient connected to nasal cannula oxygen Cardiovascular status: blood pressure returned to baseline and stable (transfusing pRBC for operative losses) Postop Assessment: no apparent nausea or vomiting Anesthetic complications: no   No complications documented.  Last Vitals:  Vitals:   02/10/21 1830 02/10/21 1833  BP: (!) 142/85   Pulse: 100 (!) 105  Resp: (!) 23 15  Temp:  36.7 C  SpO2: 96%     Last Pain:  Vitals:   02/10/21 1833  TempSrc: Oral  PainSc:     LLE Motor Response: Purposeful movement;Responds to commands (02/10/21 1830) LLE Sensation: Full sensation (02/10/21 1830)          Erling Cruz. Daveah Varone

## 2021-02-11 ENCOUNTER — Inpatient Hospital Stay (HOSPITAL_COMMUNITY): Payer: Self-pay

## 2021-02-11 LAB — TYPE AND SCREEN
ABO/RH(D): O POS
Antibody Screen: NEGATIVE
Unit division: 0
Unit division: 0
Unit division: 0
Unit division: 0

## 2021-02-11 LAB — BPAM RBC
Blood Product Expiration Date: 202205262359
Blood Product Expiration Date: 202206012359
Blood Product Expiration Date: 202206152359
Blood Product Expiration Date: 202206162359
ISSUE DATE / TIME: 202205182140
ISSUE DATE / TIME: 202205182140
ISSUE DATE / TIME: 202205201824
ISSUE DATE / TIME: 202205201824
Unit Type and Rh: 5100
Unit Type and Rh: 5100
Unit Type and Rh: 5100
Unit Type and Rh: 5100

## 2021-02-11 LAB — CBC
HCT: 28.4 % — ABNORMAL LOW (ref 39.0–52.0)
Hemoglobin: 9.6 g/dL — ABNORMAL LOW (ref 13.0–17.0)
MCH: 30.1 pg (ref 26.0–34.0)
MCHC: 33.8 g/dL (ref 30.0–36.0)
MCV: 89 fL (ref 80.0–100.0)
Platelets: 138 10*3/uL — ABNORMAL LOW (ref 150–400)
RBC: 3.19 MIL/uL — ABNORMAL LOW (ref 4.22–5.81)
RDW: 15 % (ref 11.5–15.5)
WBC: 9.8 10*3/uL (ref 4.0–10.5)
nRBC: 0 % (ref 0.0–0.2)

## 2021-02-11 LAB — BASIC METABOLIC PANEL
Anion gap: 7 (ref 5–15)
BUN: 7 mg/dL (ref 6–20)
CO2: 28 mmol/L (ref 22–32)
Calcium: 8.4 mg/dL — ABNORMAL LOW (ref 8.9–10.3)
Chloride: 99 mmol/L (ref 98–111)
Creatinine, Ser: 1.06 mg/dL (ref 0.61–1.24)
GFR, Estimated: >60 mL/min (ref >60)
Glucose, Bld: 160 mg/dL — ABNORMAL HIGH (ref 70–99)
Potassium: 4 mmol/L (ref 3.5–5.1)
Sodium: 134 mmol/L — ABNORMAL LOW (ref 135–145)

## 2021-02-11 LAB — VITAMIN D 25 HYDROXY (VIT D DEFICIENCY, FRACTURES): Vit D, 25-Hydroxy: 13.45 ng/mL — ABNORMAL LOW (ref 30–100)

## 2021-02-11 MED ORDER — CHLORHEXIDINE GLUCONATE CLOTH 2 % EX PADS
6.0000 | MEDICATED_PAD | Freq: Every day | CUTANEOUS | Status: DC
  Administered 2021-02-11 – 2021-02-14 (×4): 6 via TOPICAL

## 2021-02-11 MED ORDER — LEVOTHYROXINE SODIUM 25 MCG PO TABS
125.0000 ug | ORAL_TABLET | Freq: Every morning | ORAL | Status: DC
  Administered 2021-02-11 – 2021-02-15 (×5): 125 ug via ORAL
  Filled 2021-02-11 (×5): qty 1

## 2021-02-11 MED ORDER — AMOXICILLIN 500 MG PO CAPS
500.0000 mg | ORAL_CAPSULE | Freq: Three times a day (TID) | ORAL | Status: AC
  Administered 2021-02-12 – 2021-02-14 (×9): 500 mg via ORAL
  Filled 2021-02-11 (×10): qty 1
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
## 2021-02-11 NOTE — Plan of Care (Signed)

## 2021-02-11 NOTE — Progress Notes (Addendum)
Orthopaedic Trauma Progress Note  SUBJECTIVE: Doing fairly well this morning, pain controlled on current regimen. No chest pain. No SOB. No nausea/vomiting. No other complaints. Hasn't been up out of bed yet. Wife at bedside  OBJECTIVE:  Vitals:   02/11/21 0630 02/11/21 0704  BP:  (!) 141/82  Pulse: 81 92  Resp: 14 17  Temp:  98.9 F (37.2 C)  SpO2: 99% 100%    General: Sitting up in bed, NAD Respiratory: No increased work of breathing.  LUE: Dressing CDI. Able to wiggle fingers, tolerated gentle elbow motion. Hand warm and well perfused RLE: Dressing CDI. Tender over hip as expected. Ankle motion intact. Compartments soft and compressible. Neurovascularly intact.   IMAGING: Stable post op imaging. CT R hip ordered today  LABS:  Results for orders placed or performed during the hospital encounter of 02/08/21 (from the past 24 hour(s))  POCT I-Stat EG7     Status: Abnormal   Collection Time: 02/10/21  4:31 PM  Result Value Ref Range   pH, Ven 7.318 7.250 - 7.430   pCO2, Ven 51.7 44.0 - 60.0 mmHg   pO2, Ven 22.0 (LL) 32.0 - 45.0 mmHg   Bicarbonate 26.5 20.0 - 28.0 mmol/L   TCO2 28 22 - 32 mmol/L   O2 Saturation 31.0 %   Acid-Base Excess 0.0 0.0 - 2.0 mmol/L   Sodium 139 135 - 145 mmol/L   Potassium 3.8 3.5 - 5.1 mmol/L   Calcium, Ion 1.19 1.15 - 1.40 mmol/L   HCT 23.0 (L) 39.0 - 52.0 %   Hemoglobin 7.8 (L) 13.0 - 17.0 g/dL   Sample type VENOUS    Comment NOTIFIED PHYSICIAN   Prepare RBC (crossmatch)     Status: None   Collection Time: 02/10/21  4:47 PM  Result Value Ref Range   Order Confirmation      ORDERS RECEIVED TO CROSSMATCH Performed at [facility], [address] [place], [place] [address]   CBC     Status: Abnormal   Collection Time: 02/11/21  2:47 AM  Result Value Ref Range   WBC 9.8 4.0 - 10.5 K/uL   RBC 3.19 (L) 4.22 - 5.81 MIL/uL   Hemoglobin 9.6 (L) 13.0 - 17.0 g/dL   HCT 69.4 (L) 85.4 - 62.7 %   MCV 89.0 80.0 - 100.0 fL   MCH 30.1 26.0 -  34.0 pg   MCHC 33.8 30.0 - 36.0 g/dL   RDW 03.5 00.9 - 38.1 %   Platelets 138 (L) 150 - 400 K/uL   nRBC 0.0 0.0 - 0.2 %  Basic metabolic panel     Status: Abnormal   Collection Time: 02/11/21  2:47 AM  Result Value Ref Range   Sodium 134 (L) 135 - 145 mmol/L   Potassium 4.0 3.5 - 5.1 mmol/L   Chloride 99 98 - 111 mmol/L   CO2 28 22 - 32 mmol/L   Glucose, Bld 160 (H) 70 - 99 mg/dL   BUN 7 6 - 20 mg/dL   Creatinine, Ser 8.29 0.61 - 1.24 mg/dL   Calcium 8.4 (L) 8.9 - 10.3 mg/dL   GFR, Estimated >93 >71 mL/min   Anion gap 7 5 - 15    ASSESSMENT: [name] is a 55 y.o. male, 1 Day Post-Op S/P 1. OPEN REDUCTION INTERNAL FIXATION (ORIF) HUMERAL SHAFT FRACTURE 2. OPEN REDUCTION INTERNAL FIXATION (ORIF) ACETABULAR FRACTURE  CV/Blood loss: Hgb 9.6 this morning. Hemodynamically stable  PLAN: Weightbearing: WBAT LLE , TDWB RLE Incisional  and dressing care: Reinforce dressings as needed  Showering: OK to begin showering 02/13/21 Orthopedic device(s): Sling for comfort LUE  Pain management:  1. Tylenol 650 mg q 6 hours scheduled 2. Robaxin 500 mg q 6 hours PRN 3. Oxycodone 5-15 mg q 4 hours PRN 4. Dilaudid 0.5-1 mg q 4 hours PRN VTE prophylaxis: Lovenox, SCDs ID:  Ancef 2gm post op Foley/Lines: No foley.  KVO IVFs Impediments to Fracture Healing: Polytrauma.  Vitamin D level pending, will start supplementation as indicated Dispo: PT/OT evaluation today, dispo pending.  We will plan to change dressings tomorrow versus Monday Follow - up plan: We will follow while in hospital, plan for outpatient follow-up 2 weeks after discharge  Contact information:  Truitt Merle MD, Ulyses Southward PA-C. After hours and holidays please check [email or web address] for group call information for [facility]   Charmin Aguiniga A. Michaelyn Barter, PA-C [phone] (office) [email or web address]
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
## 2021-02-11 NOTE — Progress Notes (Signed)
Inpatient Rehab Admissions Coordinator Note:   Per PT/OT recommendations, pt was screened for CIR candidacy by Wolfgang Phoenix, MS, CCC-SLP.  At this time we are recommending an inpatient rehab consult.  AC will place consult order per protocol.  Please contact me with questions.     Wolfgang Phoenix, MS, CCC-SLP Admissions Coordinator [phone] 02/11/21 4:25 PM

## 2021-02-12 LAB — BASIC METABOLIC PANEL
Anion gap: 6 (ref 5–15)
BUN: 7 mg/dL (ref 6–20)
CO2: 28 mmol/L (ref 22–32)
Calcium: 8.4 mg/dL — ABNORMAL LOW (ref 8.9–10.3)
Chloride: 98 mmol/L (ref 98–111)
Creatinine, Ser: 1 mg/dL (ref 0.61–1.24)
GFR, Estimated: >60 mL/min (ref >60)
Glucose, Bld: 123 mg/dL — ABNORMAL HIGH (ref 70–99)
Potassium: 3.6 mmol/L (ref 3.5–5.1)
Sodium: 132 mmol/L — ABNORMAL LOW (ref 135–145)

## 2021-02-12 LAB — CBC
HCT: 27.3 % — ABNORMAL LOW (ref 39.0–52.0)
Hemoglobin: 9.5 g/dL — ABNORMAL LOW (ref 13.0–17.0)
MCH: 31 pg (ref 26.0–34.0)
MCHC: 34.8 g/dL (ref 30.0–36.0)
MCV: 89.2 fL (ref 80.0–100.0)
Platelets: 144 10*3/uL — ABNORMAL LOW (ref 150–400)
RBC: 3.06 MIL/uL — ABNORMAL LOW (ref 4.22–5.81)
RDW: 14.4 % (ref 11.5–15.5)
WBC: 8.6 10*3/uL (ref 4.0–10.5)
nRBC: 0 % (ref 0.0–0.2)

## 2021-02-12 MED ORDER — SODIUM CHLORIDE 1 G PO TABS
1.0000 g | ORAL_TABLET | Freq: Three times a day (TID) | ORAL | Status: DC
  Administered 2021-02-12 – 2021-02-15 (×10): 1 g via ORAL
  Filled 2021-02-12 (×11): qty 1

## 2021-02-12 MED ORDER — OXYCODONE HCL 5 MG PO TABS
5.0000 mg | ORAL_TABLET | ORAL | Status: DC | PRN
  Administered 2021-02-12 – 2021-02-15 (×8): 10 mg via ORAL
  Filled 2021-02-12 (×8): qty 2

## 2021-02-12 MED ORDER — POLYETHYLENE GLYCOL 3350 17 G PO PACK
17.0000 g | PACK | Freq: Every day | ORAL | Status: DC
  Administered 2021-02-12 – 2021-02-13 (×2): 17 g via ORAL
  Filled 2021-02-12: qty 1

## 2021-02-12 MED ORDER — MENTHOL 3 MG MT LOZG: 1.0000 | LOZENGE | OROMUCOSAL | Status: DC | PRN

## 2021-02-12 NOTE — Progress Notes (Signed)
Inpatient Rehab Admissions:  Inpatient Rehab Consult received.  I met with patient and wife, Zolee at the bedside for rehabilitation assessment and to discuss goals and expectations of an inpatient rehab admission.  Both acknowledge understanding of goals and expectations. Both interested in pt pursuing CIR.  Will continue to follow.  Signed: Gayland Curry, [place], [place] Admissions Coordinator [phone]

## 2021-02-12 NOTE — Progress Notes (Signed)
Subjective: 2 Days Post-Op Procedure(s) (LRB): OPEN REDUCTION INTERNAL FIXATION (ORIF) HUMERAL SHAFT FRACTURE (Left) OPEN REDUCTION INTERNAL FIXATION (ORIF) ACETABULAR FRACTURE (Right)  Patient reports pain as well controlled by meds.    Objective:   VITALS:  Temp:  [98 F (36.7 C)-99.9 F (37.7 C)] 98.9 F (37.2 C) (05/22 1526) Pulse Rate:  [78-104] 97 (05/22 1526) Resp:  [14-22] 17 (05/22 1526) BP: (126-144)/(73-89) 126/78 (05/22 1526) SpO2:  [92 %-98 %] 94 % (05/22 1526)  Neurovascular intact Sensation intact distally Intact pulses distally Dorsiflexion/Plantar flexion intact Incision: dressing C/D/I   LABS Recent Labs    02/10/21 0412 02/10/21 1631 02/11/21 0247 02/12/21 0557  HGB 9.2* 7.8* 9.6* 9.5*  WBC 10.8*  --  9.8 8.6  PLT 160  --  138* 144*   Recent Labs    02/11/21 0247 02/12/21 0557  NA 134* 132*  K 4.0 3.6  CL 99 98  CO2 28 28  BUN 7 7  CREATININE 1.06 1.00  GLUCOSE 160* 123*   No results for input(s): LABPT, INR in the last 72 hours.   Assessment/Plan: 2 Days Post-Op Procedure(s) (LRB): OPEN REDUCTION INTERNAL FIXATION (ORIF) HUMERAL SHAFT FRACTURE (Left) OPEN REDUCTION INTERNAL FIXATION (ORIF) ACETABULAR FRACTURE (Right)  Up with therapy TDWB r lower extremity Will await results of [place] regarding r calf swelling  [name] 02/12/2021, 3:44 PM
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
## 2021-02-13 ENCOUNTER — Encounter (HOSPITAL_COMMUNITY): Payer: Self-pay | Admitting: Student

## 2021-02-13 ENCOUNTER — Inpatient Hospital Stay (HOSPITAL_COMMUNITY): Payer: Self-pay

## 2021-02-13 DIAGNOSIS — M7989 Other specified soft tissue disorders: Secondary | ICD-10-CM

## 2021-02-13 LAB — BASIC METABOLIC PANEL
Anion gap: 4 — ABNORMAL LOW (ref 5–15)
BUN: 7 mg/dL (ref 6–20)
CO2: 31 mmol/L (ref 22–32)
Calcium: 8.8 mg/dL — ABNORMAL LOW (ref 8.9–10.3)
Chloride: 99 mmol/L (ref 98–111)
Creatinine, Ser: 0.95 mg/dL (ref 0.61–1.24)
GFR, Estimated: >60 mL/min (ref >60)
Glucose, Bld: 117 mg/dL — ABNORMAL HIGH (ref 70–99)
Potassium: 3.5 mmol/L (ref 3.5–5.1)
Sodium: 134 mmol/L — ABNORMAL LOW (ref 135–145)

## 2021-02-13 MED ORDER — VITAMIN D (ERGOCALCIFEROL) 1.25 MG (50000 UNIT) PO CAPS
50000.0000 [IU] | ORAL_CAPSULE | ORAL | Status: DC
  Administered 2021-02-13: 50000 [IU] via ORAL
  Filled 2021-02-13: qty 1

## 2021-02-13 MED ORDER — METHOCARBAMOL 500 MG PO TABS
1000.0000 mg | ORAL_TABLET | Freq: Three times a day (TID) | ORAL | Status: DC
  Administered 2021-02-13 – 2021-02-15 (×6): 1000 mg via ORAL
  Filled 2021-02-13 (×6): qty 2

## 2021-02-13 MED ORDER — POLYETHYLENE GLYCOL 3350 17 G PO PACK
17.0000 g | PACK | Freq: Two times a day (BID) | ORAL | Status: DC
  Administered 2021-02-13 – 2021-02-15 (×3): 17 g via ORAL
  Filled 2021-02-13 (×4): qty 1

## 2021-02-13 MED ORDER — ACETAMINOPHEN 500 MG PO TABS
1000.0000 mg | ORAL_TABLET | Freq: Four times a day (QID) | ORAL | Status: DC
  Administered 2021-02-13 – 2021-02-15 (×9): 1000 mg via ORAL
  Filled 2021-02-13 (×9): qty 2

## 2021-02-13 MED ORDER — HYDROMORPHONE HCL 1 MG/ML IJ SOLN: 0.5000 mg | Freq: Three times a day (TID) | INTRAMUSCULAR | Status: DC | PRN

## 2021-02-13 MED ORDER — KETOROLAC TROMETHAMINE 15 MG/ML IJ SOLN
15.0000 mg | Freq: Four times a day (QID) | INTRAMUSCULAR | Status: AC
  Administered 2021-02-13 – 2021-02-14 (×5): 15 mg via INTRAVENOUS
  Filled 2021-02-13 (×5): qty 1

## 2021-02-13 NOTE — Progress Notes (Signed)
Lower extremity venous RT study completed.   Please see CV Proc for preliminary results.   Romir Klimowicz, RDMS, RVT  

## 2021-02-13 NOTE — Progress Notes (Signed)
Orthopaedic Trauma Progress Note  SUBJECTIVE: Doing fairly well this morning, pain continues to be an issue. Rate pain 8/10. Will add Toradol this AM.  No chest pain. No SOB. No nausea/vomiting. No other complaints. Hasn't been up out of bed yet. Wife at bedside. Venous U/S of RLE has been ordered due to significant swelling.   OBJECTIVE:  Vitals:   02/12/21 2328 02/13/21 0531  BP: 121/90 (!) 142/91  Pulse: 97 92  Resp: (!) 21 19  Temp: 99.2 F (37.3 C) 99.1 F (37.3 C)  SpO2: 94% 93%    General: Sitting up in bed, NAD Respiratory: No increased work of breathing.  LUE: Dressing removed, incision CDI. Able to wiggle fingers, tolerated gentle elbow motion. Forward elevation of shoulder to about 65 degrees.  Hand warm and well perfused. +radial pulse RLE: Dressing removed, incision CDI. Tender over hip as expected. Ankle motion intact. Swelling through thigh but compartments compressible throughout extremity. No calf tenderness. Neurovascularly intact.   IMAGING: Stable post op imaging.   LABS:  Results for orders placed or performed during the hospital encounter of 02/08/21 (from the past 24 hour(s))  Basic metabolic panel     Status: Abnormal   Collection Time: 02/13/21  4:05 AM  Result Value Ref Range   Sodium 134 (L) 135 - 145 mmol/L   Potassium 3.5 3.5 - 5.1 mmol/L   Chloride 99 98 - 111 mmol/L   CO2 31 22 - 32 mmol/L   Glucose, Bld 117 (H) 70 - 99 mg/dL   BUN 7 6 - 20 mg/dL   Creatinine, Ser 5.73 0.61 - 1.24 mg/dL   Calcium 8.8 (L) 8.9 - 10.3 mg/dL   GFR, Estimated >22 >02 mL/min   Anion gap 4 (L) 5 - 15    ASSESSMENT: [name] is a 55 y.o. male, 3 Days Post-Op S/P 1. OPEN REDUCTION INTERNAL FIXATION (ORIF) HUMERAL SHAFT FRACTURE 2. OPEN REDUCTION INTERNAL FIXATION (ORIF) ACETABULAR FRACTURE  CV/Blood loss: Hgb 9.5 on 02/12/21, stable. Hemodynamically stable  PLAN: Weightbearing: WBAT LLE , TDWB RLE Incisional and dressing care: Change PRN. Ok to leave open  to air Showering: OK to begin showering 02/13/21 Orthopedic device(s): Sling for comfort LUE  Pain management:  1. Tylenol 1000 mg q 6 hours scheduled 2. Robaxin 500 mg q 6 hours PRN 3. Oxycodone 5-10 mg q 4 hours PRN 4. Dilaudid 0.5-1 mg q 4 hours PRN 5. Toradol 15 mg q 6 hours x 5 doses VTE prophylaxis: Lovenox, SCDs ID:  Ancef 2gm post op completed Foley/Lines: No foley.  KVO IVFs Impediments to Fracture Healing: Polytrauma.  Vitamin D level 13, will start D2 supplementation  Dispo: Therapies as tolerated. PT/OT recommending CIR. Ok for d/c to next venue from ortho standpoint. If d/c home, recommend transition to Eliquis x 30 days. If d/c to CIR, continue Lovenox Follow - up plan: We will follow while in hospital, plan for outpatient follow-up 2 weeks after discharge  Contact information:  Truitt Merle MD, Ulyses Southward PA-C. After hours and holidays please check [email or web address] for group call information for [facility]   Brandon Scarbrough A. Michaelyn Barter, PA-C [phone] (office) [email or web address]
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
## 2021-02-13 NOTE — Progress Notes (Signed)
Subjective: No new c/o. Resting comfortably in chair.  Objective: Vital signs in last 24 hours: Temp:  [98.3 F (36.8 C)-99.2 F (37.3 C)] 98.3 F (36.8 C) (05/23 0900) Pulse Rate:  [82-97] 92 (05/23 0531) Resp:  [17-21] 20 (05/23 0900) BP: (121-142)/(78-91) 131/79 (05/23 0900) SpO2:  [93 %-95 %] 93 % (05/23 0531)  General appearance: alert and cooperative  Head: Scalp laceration healing with staples in place. JP with serosang. drainage. Facial: Laceration closed with sutures. Right periorbital edema. Ears: Examination of the ears shows normal auricles and external auditory canals bilaterally.  Nose: Nasal examination shows normal mucosa, septum, turbinates.  Mouth: Oral cavity examination shows no mucosal lacerations. No significant trismus is noted.  Neck: Palpation of the neck reveals no lymphadenopathy or mass. The trachea is midline.    Recent Labs    02/11/21 0247 02/12/21 0557  WBC 9.8 8.6  HGB 9.6* 9.5*  HCT 28.4* 27.3*  PLT 138* 144*   Recent Labs    02/12/21 0557 02/13/21 0405  NA 132* 134*  K 3.6 3.5  CL 98 99  CO2 28 31  GLUCOSE 123* 117*  BUN 7 7  CREATININE 1.00 0.95  CALCIUM 8.4* 8.8*    Medications:  I have reviewed the patient's current medications. Scheduled: . sodium chloride   Intravenous Once  . acetaminophen  1,000 mg Oral Q6H  . amoxicillin  500 mg Oral Q8H  . artificial tears   Right Eye QHS  . bacitracin   Topical Daily  . bacitracin-polymyxin b  1 application Right Eye BID  . Chlorhexidine Gluconate Cloth  6 each Topical Daily  . docusate sodium  100 mg Oral BID  . enoxaparin (LOVENOX) injection  40 mg Subcutaneous Q24H  . ketorolac  15 mg Intravenous Q6H  . levothyroxine  125 mcg Oral q morning  . methocarbamol  1,000 mg Oral TID  . polyethylene glycol  17 g Oral BID  . sodium chloride  1 g Oral TID WC  . Vitamin D (Ergocalciferol)  50,000 Units Oral Q7 days   Continuous:   Assessment/Plan: Extensive facial and scalp  lacerations, POD#4 s/p repair. - JP removed without difficulty - Will remove sutures and staples on Wednesday.   LOS: 5 days   Matheo Rathbone W Sherley Mckenney 02/13/2021, 12:13 PM
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
## 2021-02-13 NOTE — Progress Notes (Signed)
  Speech Language Pathology  Patient Details Name: [name] MRN: [id number] DOB: 1966-08-20 Today's Date: 02/13/2021 Time:  -      Pt screened for speech-language-cognition. No concerns at this time on the acute venue. He would benefit from assessment on next level of care for executive functions.                          Royce Macadamia 02/13/2021, 3:47 PM  Breck Coons Lonell Face.Ed Nurse, children's [phone] Office [phone]

## 2021-02-14 MED ORDER — BISACODYL 10 MG RE SUPP
10.0000 mg | Freq: Once | RECTAL | Status: AC
  Administered 2021-02-14: 10 mg via RECTAL
  Filled 2021-02-14: qty 1
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
## 2021-02-14 NOTE — Plan of Care (Signed)
  Problem: Health Behavior/Discharge Planning: Goal: Ability to manage health-related needs will improve Outcome: Progressing   

## 2021-02-14 NOTE — Progress Notes (Signed)
PT Cancellation Note  Patient Details Name: [name] MRN: [id number] DOB: 27-Aug-1966   Cancelled Treatment:    Reason Eval/Treat Not Completed: (P) Other (comment) (Wife present at bedside and patient sleeping soundly, wife requested not to wake him as this is the first real rest he has had in the hospital.  Will f/u if time permits.)   Florestine Avers 02/14/2021, 4:33 PM  Bonney Leitz , PTA Acute Rehabilitation Services Pager [phone] Office [phone]

## 2021-02-14 NOTE — Progress Notes (Signed)
Patient transferred from 4N ICU to 6N30. Alert and oriented x4. Vital signs WNL.Oriented to room and remote. Denies c/o pain.
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
## 2021-02-14 NOTE — Progress Notes (Signed)
Inpatient Rehabilitation Admissions Coordinator  I met at bedside with patient and his wife. I provided them an Estimate of Cost of care for CIR. I await bed availability for a possible Cir admit.  Danne Baxter, RN, MSN Rehab Admissions Coordinator [phone] 02/14/2021 12:45 PM
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
## 2021-02-15 ENCOUNTER — Other Ambulatory Visit: Payer: Self-pay

## 2021-02-15 ENCOUNTER — Encounter (HOSPITAL_COMMUNITY): Payer: Self-pay | Admitting: Physical Medicine and Rehabilitation

## 2021-02-15 ENCOUNTER — Inpatient Hospital Stay (HOSPITAL_COMMUNITY)
Admission: RE | Admit: 2021-02-15 | Discharge: 2021-03-02 | DRG: 560 | Disposition: A | Discharge status: Home / Self Care | Payer: Self-pay | Source: Intra-hospital | Attending: Physical Medicine and Rehabilitation | Admitting: Physical Medicine and Rehabilitation

## 2021-02-15 DIAGNOSIS — T07XXXA Unspecified multiple injuries, initial encounter: Secondary | ICD-10-CM

## 2021-02-15 DIAGNOSIS — S36892D Contusion of other intra-abdominal organs, subsequent encounter: Secondary | ICD-10-CM

## 2021-02-15 DIAGNOSIS — E871 Hypo-osmolality and hyponatremia: Secondary | ICD-10-CM

## 2021-02-15 DIAGNOSIS — D62 Acute posthemorrhagic anemia: Secondary | ICD-10-CM

## 2021-02-15 DIAGNOSIS — R52 Pain, unspecified: Secondary | ICD-10-CM

## 2021-02-15 DIAGNOSIS — D696 Thrombocytopenia, unspecified: Secondary | ICD-10-CM

## 2021-02-15 DIAGNOSIS — E039 Hypothyroidism, unspecified: Secondary | ICD-10-CM | POA: Diagnosis present

## 2021-02-15 DIAGNOSIS — S01111D Laceration without foreign body of right eyelid and periocular area, subsequent encounter: Secondary | ICD-10-CM

## 2021-02-15 DIAGNOSIS — S83521D Sprain of posterior cruciate ligament of right knee, subsequent encounter: Secondary | ICD-10-CM

## 2021-02-15 DIAGNOSIS — Z91014 Allergy to mammalian meats: Secondary | ICD-10-CM

## 2021-02-15 DIAGNOSIS — S42302D Unspecified fracture of shaft of humerus, left arm, subsequent encounter for fracture with routine healing: Secondary | ICD-10-CM

## 2021-02-15 DIAGNOSIS — M25461 Effusion, right knee: Secondary | ICD-10-CM | POA: Diagnosis not present

## 2021-02-15 DIAGNOSIS — E669 Obesity, unspecified: Secondary | ICD-10-CM | POA: Diagnosis present

## 2021-02-15 DIAGNOSIS — S0101XD Laceration without foreign body of scalp, subsequent encounter: Secondary | ICD-10-CM

## 2021-02-15 DIAGNOSIS — S42309A Unspecified fracture of shaft of humerus, unspecified arm, initial encounter for closed fracture: Secondary | ICD-10-CM

## 2021-02-15 DIAGNOSIS — S322XXD Fracture of coccyx, subsequent encounter for fracture with routine healing: Secondary | ICD-10-CM

## 2021-02-15 DIAGNOSIS — S32421D Displaced fracture of posterior wall of right acetabulum, subsequent encounter for fracture with routine healing: Principal | ICD-10-CM

## 2021-02-15 DIAGNOSIS — Z6829 Body mass index (BMI) 29.0-29.9, adult: Secondary | ICD-10-CM

## 2021-02-15 DIAGNOSIS — I959 Hypotension, unspecified: Secondary | ICD-10-CM | POA: Diagnosis present

## 2021-02-15 DIAGNOSIS — M5410 Radiculopathy, site unspecified: Secondary | ICD-10-CM | POA: Diagnosis present

## 2021-02-15 DIAGNOSIS — S83281D Other tear of lateral meniscus, current injury, right knee, subsequent encounter: Secondary | ICD-10-CM

## 2021-02-15 DIAGNOSIS — R509 Fever, unspecified: Secondary | ICD-10-CM

## 2021-02-15 DIAGNOSIS — S83411D Sprain of medial collateral ligament of right knee, subsequent encounter: Secondary | ICD-10-CM

## 2021-02-15 DIAGNOSIS — G8918 Other acute postprocedural pain: Secondary | ICD-10-CM

## 2021-02-15 DIAGNOSIS — R29810 Facial weakness: Secondary | ICD-10-CM | POA: Diagnosis present

## 2021-02-15 DIAGNOSIS — S0181XD Laceration without foreign body of other part of head, subsequent encounter: Secondary | ICD-10-CM

## 2021-02-15 LAB — CBC
HCT: 29.9 % — ABNORMAL LOW (ref 39.0–52.0)
Hemoglobin: 10.1 g/dL — ABNORMAL LOW (ref 13.0–17.0)
MCH: 30.3 pg (ref 26.0–34.0)
MCHC: 33.8 g/dL (ref 30.0–36.0)
MCV: 89.8 fL (ref 80.0–100.0)
Platelets: 353 10*3/uL (ref 150–400)
RBC: 3.33 MIL/uL — ABNORMAL LOW (ref 4.22–5.81)
RDW: 13.8 % (ref 11.5–15.5)
WBC: 8 10*3/uL (ref 4.0–10.5)
nRBC: 0 % (ref 0.0–0.2)

## 2021-02-15 LAB — CREATININE, SERUM
Creatinine, Ser: 1.06 mg/dL (ref 0.61–1.24)
GFR, Estimated: >60 mL/min (ref >60)

## 2021-02-15 MED ORDER — ARTIFICIAL TEARS OPHTHALMIC OINT
TOPICAL_OINTMENT | Freq: Every day | OPHTHALMIC | Status: DC
  Filled 2021-02-15: qty 3.5

## 2021-02-15 MED ORDER — ENOXAPARIN SODIUM 40 MG/0.4ML IJ SOSY: 40.0000 mg | PREFILLED_SYRINGE | INTRAMUSCULAR | Status: DC

## 2021-02-15 MED ORDER — BACITRACIN ZINC 500 UNIT/GM EX OINT
TOPICAL_OINTMENT | Freq: Every day | CUTANEOUS | Status: DC
  Administered 2021-02-17: 1 via TOPICAL

## 2021-02-15 MED ORDER — VITAMIN D (ERGOCALCIFEROL) 1.25 MG (50000 UNIT) PO CAPS
50000.0000 [IU] | ORAL_CAPSULE | ORAL | Status: DC
  Administered 2021-02-20 – 2021-02-27 (×2): 50000 [IU] via ORAL
  Filled 2021-02-15 (×2): qty 1

## 2021-02-15 MED ORDER — ACETAMINOPHEN 325 MG PO TABS
325.0000 mg | ORAL_TABLET | ORAL | Status: DC | PRN
Start: 2021-02-15 — End: 2021-03-02
  Administered 2021-02-15: 650 mg via ORAL
  Administered 2021-02-16: 325 mg via ORAL
  Administered 2021-02-19 – 2021-02-21 (×2): 650 mg via ORAL
  Filled 2021-02-15 (×5): qty 2

## 2021-02-15 MED ORDER — ONDANSETRON 4 MG PO TBDP
4.0000 mg | ORAL_TABLET | Freq: Four times a day (QID) | ORAL | Status: DC | PRN
  Filled 2021-02-15: qty 1

## 2021-02-15 MED ORDER — LEVOTHYROXINE SODIUM 25 MCG PO TABS
125.0000 ug | ORAL_TABLET | Freq: Every morning | ORAL | Status: DC
  Administered 2021-02-16 – 2021-03-02 (×15): 125 ug via ORAL
  Filled 2021-02-15 (×15): qty 1

## 2021-02-15 MED ORDER — DOCUSATE SODIUM 100 MG PO CAPS
100.0000 mg | ORAL_CAPSULE | Freq: Two times a day (BID) | ORAL | Status: DC
  Administered 2021-02-15 – 2021-03-01 (×21): 100 mg via ORAL
  Filled 2021-02-15 (×28): qty 1

## 2021-02-15 MED ORDER — SODIUM CHLORIDE 1 G PO TABS
1.0000 g | ORAL_TABLET | Freq: Three times a day (TID) | ORAL | Status: DC
  Administered 2021-02-15 – 2021-03-02 (×44): 1 g via ORAL
  Filled 2021-02-15 (×44): qty 1

## 2021-02-15 MED ORDER — METHOCARBAMOL 500 MG PO TABS
1000.0000 mg | ORAL_TABLET | Freq: Three times a day (TID) | ORAL | Status: DC
  Administered 2021-02-15 – 2021-03-02 (×45): 1000 mg via ORAL
  Filled 2021-02-15 (×47): qty 2

## 2021-02-15 MED ORDER — BACITRACIN-POLYMYXIN B 500-10000 UNIT/GM OP OINT
1.0000 "application " | TOPICAL_OINTMENT | Freq: Two times a day (BID) | OPHTHALMIC | Status: DC
  Administered 2021-02-15 – 2021-03-01 (×29): 1 via OPHTHALMIC
  Filled 2021-02-15 (×3): qty 3.5

## 2021-02-15 MED ORDER — ONDANSETRON HCL 4 MG/2ML IJ SOLN: 4.0000 mg | Freq: Four times a day (QID) | INTRAMUSCULAR | Status: DC | PRN

## 2021-02-15 MED ORDER — ENOXAPARIN SODIUM 40 MG/0.4ML IJ SOSY
40.0000 mg | PREFILLED_SYRINGE | INTRAMUSCULAR | Status: DC
  Administered 2021-02-16 – 2021-02-27 (×12): 40 mg via SUBCUTANEOUS
  Filled 2021-02-15 (×12): qty 0.4

## 2021-02-15 MED ORDER — POLYETHYLENE GLYCOL 3350 17 G PO PACK
17.0000 g | PACK | Freq: Two times a day (BID) | ORAL | Status: DC
  Administered 2021-02-16 – 2021-02-27 (×6): 17 g via ORAL
  Filled 2021-02-15 (×22): qty 1

## 2021-02-15 MED ORDER — OXYCODONE HCL 5 MG PO TABS
5.0000 mg | ORAL_TABLET | ORAL | Status: DC | PRN
Start: 2021-02-15 — End: 2021-03-02
  Administered 2021-02-15: 10 mg via ORAL
  Administered 2021-02-16: 5 mg via ORAL
  Administered 2021-02-16: 10 mg via ORAL
  Administered 2021-02-17: 5 mg via ORAL
  Administered 2021-02-17 – 2021-02-20 (×3): 10 mg via ORAL
  Filled 2021-02-15 (×3): qty 2
  Filled 2021-02-15: qty 1
  Filled 2021-02-15 (×3): qty 2

## 2021-02-15 NOTE — Progress Notes (Signed)
Inpatient Rehabilitation Admissions Coordinator  CIR bed is available to admit patient today. I met with patient at bedside and he is in agreement. I have notified Kathlee Nations, [place], acute team and Rosaria Ferries RN CM. I will make the arrangements to admit today.  Danne Baxter, RN, MSN Rehab Admissions Coordinator [phone] 02/15/2021 10:09 AM
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
## 2021-02-15 NOTE — Progress Notes (Signed)
INPATIENT REHABILITATION ADMISSION NOTE   Arrival Method: Wheelchair     Mental Orientation: A/O x4    Assessment: WNL   Skin: Incision to Right Hip- Skin glue, intact  Incision to Right Head- C,D,I Two incisions below Right knee- skin glue, intact   IV'S: None  Pain: No pain  Tubes and Drains: None  Safety Measures: TDWB on RLE  Vital Signs: 132/80 85 pulse 16 resp 100 O2      Family:  Wife at bedside

## 2021-02-15 NOTE — Progress Notes (Signed)
Patient transferred to 4W26 via bed. Belongings and spouse accompanied patient to his new bed assignment. Care transferred to [facility]. It has been a pleasure to have [name].

## 2021-02-15 NOTE — Progress Notes (Signed)
Subjective: No c/o. Resting comfortably in bed.  Objective: Vital signs in last 24 hours: Temp:  [98.5 F (36.9 C)-99.1 F (37.3 C)] 99.1 F (37.3 C) (05/25 0441) Pulse Rate:  [83-86] 83 (05/25 0441) Resp:  [16-18] 17 (05/25 0441) BP: (122-140)/(75-88) 140/88 (05/25 0441) SpO2:  [100 %] 100 % (05/25 0441)  General appearance:alert and cooperative Head: Scalp laceration healing with staples in place.  Facial: Laceration closed with sutures. Right periorbital edema. Ears: Examination of the ears shows normal auricles and external auditory canals bilaterally.  Nose: Nasal examination shows normal mucosa, septum, turbinates.  Mouth: Oral cavity examination shows no mucosal lacerations. No significant trismus is noted.  Neck: Palpation of the neck reveals no lymphadenopathy or mass. The trachea is midline.   No results for input(s): WBC, HGB, HCT, PLT in the last 72 hours. Recent Labs    02/13/21 0405  NA 134*  K 3.5  CL 99  CO2 31  GLUCOSE 117*  BUN 7  CREATININE 0.95  CALCIUM 8.8*    Medications:  I have reviewed the patient's current medications. Scheduled: . sodium chloride   Intravenous Once  . acetaminophen  1,000 mg Oral Q6H  . artificial tears   Right Eye QHS  . bacitracin   Topical Daily  . bacitracin-polymyxin b  1 application Right Eye BID  . Chlorhexidine Gluconate Cloth  6 each Topical Daily  . docusate sodium  100 mg Oral BID  . enoxaparin (LOVENOX) injection  40 mg Subcutaneous Q24H  . levothyroxine  125 mcg Oral q morning  . methocarbamol  1,000 mg Oral TID  . polyethylene glycol  17 g Oral BID  . sodium chloride  1 g Oral TID WC  . Vitamin D (Ergocalciferol)  50,000 Units Oral Q7 days   Continuous:  UJW:JXBJYNWGNFAOZ (DILAUDID) injection, menthol-cetylpyridinium, ondansetron **OR** ondansetron (ZOFRAN) IV, oxyCODONE  Assessment/Plan: Extensive facial and scalp lacerations, s/p repair last week. - All sutures and staples are removed without  difficulty. Healing well. - Will sign off.   LOS: 7 days   [name] W [name] 02/15/2021, 8:21 AM
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
## 2021-02-15 NOTE — Progress Notes (Signed)
INPATIENT REHABILITATION ADMISSION NOTE   Arrival Method: bed     Mental Orientation: A+Ox4   Assessment: Completed   Skin: see flowsheets   IV'S: None   Pain: None   Tubes and Drains: None   Safety Measures: bed alarm on, call light in reach, safety plan initiated and educated on.    Vital Signs: done   Height and Weight: see flowsheets   Rehab Orientation: Completed   Family: at bedside   Mylo Red, LPN

## 2021-02-16 ENCOUNTER — Encounter (HOSPITAL_COMMUNITY): Payer: Self-pay

## 2021-02-16 DIAGNOSIS — I951 Orthostatic hypotension: Secondary | ICD-10-CM

## 2021-02-16 DIAGNOSIS — T07XXXA Unspecified multiple injuries, initial encounter: Secondary | ICD-10-CM

## 2021-02-16 DIAGNOSIS — G8918 Other acute postprocedural pain: Secondary | ICD-10-CM

## 2021-02-16 LAB — COMPREHENSIVE METABOLIC PANEL
ALT: 68 U/L — ABNORMAL HIGH (ref 0–44)
AST: 60 U/L — ABNORMAL HIGH (ref 15–41)
Albumin: 2.8 g/dL — ABNORMAL LOW (ref 3.5–5.0)
Alkaline Phosphatase: 66 U/L (ref 38–126)
Anion gap: 8 (ref 5–15)
BUN: 12 mg/dL (ref 6–20)
CO2: 28 mmol/L (ref 22–32)
Calcium: 9.5 mg/dL (ref 8.9–10.3)
Chloride: 98 mmol/L (ref 98–111)
Creatinine, Ser: 1.11 mg/dL (ref 0.61–1.24)
GFR, Estimated: >60 mL/min (ref >60)
Glucose, Bld: 121 mg/dL — ABNORMAL HIGH (ref 70–99)
Potassium: 4.2 mmol/L (ref 3.5–5.1)
Sodium: 134 mmol/L — ABNORMAL LOW (ref 135–145)
Total Bilirubin: 0.7 mg/dL (ref 0.3–1.2)
Total Protein: 6.3 g/dL — ABNORMAL LOW (ref 6.5–8.1)

## 2021-02-16 LAB — CBC WITH DIFFERENTIAL/PLATELET
Abs Immature Granulocytes: 0.47 10*3/uL — ABNORMAL HIGH (ref 0.00–0.07)
Basophils Absolute: 0.1 10*3/uL (ref 0.0–0.1)
Basophils Relative: 1 %
Eosinophils Absolute: 0.2 10*3/uL (ref 0.0–0.5)
Eosinophils Relative: 2 %
HCT: 30.8 % — ABNORMAL LOW (ref 39.0–52.0)
Hemoglobin: 10.3 g/dL — ABNORMAL LOW (ref 13.0–17.0)
Immature Granulocytes: 6 %
Lymphocytes Relative: 19 %
Lymphs Abs: 1.6 10*3/uL (ref 0.7–4.0)
MCH: 30.3 pg (ref 26.0–34.0)
MCHC: 33.4 g/dL (ref 30.0–36.0)
MCV: 90.6 fL (ref 80.0–100.0)
Monocytes Absolute: 1.1 10*3/uL — ABNORMAL HIGH (ref 0.1–1.0)
Monocytes Relative: 13 %
Neutro Abs: 5 10*3/uL (ref 1.7–7.7)
Neutrophils Relative %: 59 %
Platelets: 354 10*3/uL (ref 150–400)
RBC: 3.4 MIL/uL — ABNORMAL LOW (ref 4.22–5.81)
RDW: 13.8 % (ref 11.5–15.5)
WBC: 8.4 10*3/uL (ref 4.0–10.5)
nRBC: 0 % (ref 0.0–0.2)

## 2021-02-16 MED ORDER — TRAMADOL HCL 50 MG PO TABS
100.0000 mg | ORAL_TABLET | Freq: Three times a day (TID) | ORAL | Status: DC
Start: 2021-02-16 — End: 2021-03-02
  Administered 2021-02-16 – 2021-03-02 (×42): 100 mg via ORAL
  Filled 2021-02-16 (×42): qty 2

## 2021-02-16 NOTE — Progress Notes (Signed)
Occupational Therapy Session Note  Patient Details  Name: [name] MRN: [id number] Date of Birth: 01/11/1966  Today's Date: 02/16/2021 OT Individual Time: 1300-1358 OT Individual Time Calculation (min): 58 min    Short Term Goals: Week 1:  OT Short Term Goal 1 (Week 1): Pt will perform BSC/toilet transfers w/ AD with Min A OT Short Term Goal 2 (Week 1): Pt will perform LB dressing w/ AE no more than Min A OT Short Term Goal 3 (Week 1): Pt will perform UB/LB bathing with no more than Min A  Skilled Therapeutic Interventions/Progress Updates:  Pt greeted at time of session supine in bed resting agreeable to OT session, no c/o pain. Wife present and remained throughout. Retrieved 20x18 cushion for chair at beginning of session for comfort. Supine > sit Mod A for LEs. BP checked with NT in supine semireclined: 119/91, seated: 114/56, and standing: 98/73. Pt with mild to no c/o dizziness today. Sit > stands x3 Mod A throughout, focus on body mechanics for sit <> stands, preventing rounded shoulders, and promoting good posture and preventing "flopping" into chair while maintaining WB status. Once up in chair, needing to use urinal and therapist stepped out for privacy, able to use with Set up with wife present. Extenisve discussion with wife as well regarding CLOF, goals, OT role. Pt sitting up in wheelchair with alarm on call bell in reach.   Therapy Documentation Precautions:  Precautions Precautions: Fall Restrictions Weight Bearing Restrictions: Yes LUE Weight Bearing: Weight bearing as tolerated RLE Weight Bearing: Touchdown weight bearing Other Position/Activity Restrictions: RLE TDWB, LUE WBAT     Therapy/Group: Individual Therapy  [name] Score 02/16/2021, 1:24 PM
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
## 2021-02-16 NOTE — Progress Notes (Signed)
Orthopedic Tech Progress Note Patient Details:  [name] March 09, 1966 [id number]  Ortho Devices Type of Ortho Device: Abdominal binder Ortho Device/Splint Interventions: Ordered       [name] [name] 02/16/2021, 9:49 AM

## 2021-02-16 NOTE — Progress Notes (Signed)
Inpatient Rehabilitation Center Individual Statement of Services  Patient Name:  [name]  Date:  02/16/2021  Welcome to the Inpatient Rehabilitation Center.  Our goal is to provide you with an individualized program based on your diagnosis and situation, designed to meet your specific needs.  With this comprehensive rehabilitation program, you will be expected to participate in at least 3 hours of rehabilitation therapies Monday-Friday, with modified therapy programming on the weekends.  Your rehabilitation program will include the following services:  Physical Therapy (PT), Occupational Therapy (OT), Speech Therapy (ST), 24 hour per day rehabilitation nursing, Therapeutic Recreaction (TR), Neuropsychology, Care Coordinator, Rehabilitation Medicine, Nutrition Services, Pharmacy Services and Other  Weekly team conferences will be held on Tuesdays to discuss your progress.  Your Inpatient Rehabilitation Care Coordinator will talk with you frequently to get your input and to update you on team discussions.  Team conferences with you and your family in attendance may also be held.  Expected length of stay: 14-16 Days  Overall anticipated outcome: Supervision  Depending on your progress and recovery, your program may change. Your Inpatient Rehabilitation Care Coordinator will coordinate services and will keep you informed of any changes. Your Inpatient Rehabilitation Care Coordinator's name and contact numbers are listed  below.  The following services may also be recommended but are not provided by the Inpatient Rehabilitation Center:    Home Health Rehabiltiation Services  Outpatient Rehabilitation Services    Arrangements will be made to provide these services after discharge if needed.  Arrangements include referral to agencies that provide these services.  Your insurance has been verified to be:  UNINSURED Your primary doctor is:  NO PCP  Pertinent information will be shared  with your doctor and your insurance company.  Inpatient Rehabilitation Care Coordinator:  Lavera Guise, [place] [phone] or [phone]  Information discussed with and copy given to patient by: Andria Rhein, 02/16/2021, 10:34 AM

## 2021-02-16 NOTE — Progress Notes (Signed)
Inpatient Rehabilitation  Patient information reviewed and entered into eRehab system by Bethel Gaglio Mahogony Gilchrest, OTR/L.   Information including medical coding, functional ability and quality indicators will be reviewed and updated through discharge.    
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
## 2021-02-16 NOTE — Plan of Care (Signed)
  Problem: RH Balance Goal: LTG Patient will maintain dynamic standing with ADLs (OT) Description: LTG:  Patient will maintain dynamic standing balance with assist during activities of daily living (OT)  Flowsheets (Taken 02/16/2021 1640) LTG: Pt will maintain dynamic standing balance during ADLs with: Supervision/Verbal cueing   Problem: Sit to Stand Goal: LTG:  Patient will perform sit to stand in prep for activites of daily living with assistance level (OT) Description: LTG:  Patient will perform sit to stand in prep for activites of daily living with assistance level (OT) Flowsheets (Taken 02/16/2021 1640) LTG: PT will perform sit to stand in prep for activites of daily living with assistance level: Supervision/Verbal cueing   Problem: RH Grooming Goal: LTG Patient will perform grooming w/assist,cues/equip (OT) Description: LTG: Patient will perform grooming with assist, with/without cues using equipment (OT) Flowsheets (Taken 02/16/2021 1640) LTG: Pt will perform grooming with assistance level of: Set up assist    Problem: RH Bathing Goal: LTG Patient will bathe all body parts with assist levels (OT) Description: LTG: Patient will bathe all body parts with assist levels (OT) Flowsheets (Taken 02/16/2021 1640) LTG: Pt will perform bathing with assistance level/cueing: Supervision/Verbal cueing   Problem: RH Dressing Goal: LTG Patient will perform upper body dressing (OT) Description: LTG Patient will perform upper body dressing with assist, with/without cues (OT). Flowsheets (Taken 02/16/2021 1640) LTG: Pt will perform upper body dressing with assistance level of: Set up assist Goal: LTG Patient will perform lower body dressing w/assist (OT) Description: LTG: Patient will perform lower body dressing with assist, with/without cues in positioning using equipment (OT) Flowsheets (Taken 02/16/2021 1640) LTG: Pt will perform lower body dressing with assistance level of: Supervision/Verbal  cueing   Problem: RH Toileting Goal: LTG Patient will perform toileting task (3/3 steps) with assistance level (OT) Description: LTG: Patient will perform toileting task (3/3 steps) with assistance level (OT)  Flowsheets (Taken 02/16/2021 1640) LTG: Pt will perform toileting task (3/3 steps) with assistance level: Supervision/Verbal cueing   Problem: RH Toilet Transfers Goal: LTG Patient will perform toilet transfers w/assist (OT) Description: LTG: Patient will perform toilet transfers with assist, with/without cues using equipment (OT) Flowsheets (Taken 02/16/2021 1640) LTG: Pt will perform toilet transfers with assistance level of: Supervision/Verbal cueing   Problem: RH Tub/Shower Transfers Goal: LTG Patient will perform tub/shower transfers w/assist (OT) Description: LTG: Patient will perform tub/shower transfers with assist, with/without cues using equipment (OT) Flowsheets (Taken 02/16/2021 1640) LTG: Pt will perform tub/shower stall transfers with assistance level of: Supervision/Verbal cueing

## 2021-02-17 DIAGNOSIS — M5431 Sciatica, right side: Secondary | ICD-10-CM

## 2021-02-17 MED ORDER — ARTIFICIAL TEARS OPHTHALMIC OINT
TOPICAL_OINTMENT | OPHTHALMIC | Status: DC | PRN
  Administered 2021-02-17: 1 via OPHTHALMIC
  Filled 2021-02-17: qty 3.5

## 2021-02-17 MED ORDER — PREGABALIN 50 MG PO CAPS
50.0000 mg | ORAL_CAPSULE | Freq: Two times a day (BID) | ORAL | Status: DC
  Administered 2021-02-17 – 2021-03-02 (×27): 50 mg via ORAL
  Filled 2021-02-17 (×28): qty 1

## 2021-02-17 NOTE — Progress Notes (Signed)
Physical Therapy Session Note  Patient Details  Name: [name] MRN: [id number] Date of Birth: 1966-06-13  Today's Date: 02/17/2021 PT Individual JOIT:[id number] and 1430-1530 PT Individual Time Calculation (min): 30 mins and 60 min   Short Term Goals: Week 1:  PT Short Term Goal 1 (Week 1): pt to demonstrate supine<>sit min A PT Short Term Goal 2 (Week 1): pt to demonstrate functional transfers within [facility] precautions with LRAD at min A PT Short Term Goal 3 (Week 1): pt to demonstrate ability to hop/gait train within WB status for 10' min A PT Short Term Goal 4 (Week 1): pt to demonstrate tolerance to sitting OOB 3 hours  Skilled Therapeutic Interventions/Progress Updates:    pt received in [facility] and agreeable to therapy. Pt denied pain at rest and reported 6/10 pain in Rt hip with returning to sitting later in session. Pt taken to gym in [facility] total A for time, directed in Sit to stand from [facility] to Rolling walker mod A and directed in static standing within WB status at min A and pt reported this had no pain, returning to sit min A and pt reported increase in pain. Pt directed in 2" marching on RLE (under 90 degrees of hip flexion) to improve muscle activation and coordination of mobility in RLE, x10 and LAQ x10 with pt able to complete ~25% range without external support. Pt then directed in WC mobility 35' CGA and fatigued in LUE and requested rest. Pt taken back to room total A for time and energy. Pt left in room, wife present and handed off to NCT in room.   Session 2: pt received in [facility] and agreeable to therapy. Pt taken to day room in [facility] total A for time and energy. Pt directed in x3 Sit to stand to Rolling walker mod A-min A and once in standing CGA within WB status. Pt directed in deep breathing pattern techniques with BUE stretching activities x5 B shoulder flexion to 90 degrees, shoulder abduction (to tolerance without pain in LUE), to improve pain levels and to promote relaxation. Pt  reported much better after this. Pt directed in seated B hip flexion (decreased ROM noted 2/2 pain), knee extension, 2x10 with light active assist on RLE for both. Pt directed in x4 hopping forward with Rolling walker within WB status at min A-CGA. Pt then returned to room total A in WC. And requested to return to bed, min A to complete with Rolling walker. Pt left in bed, All needs in reach and in good condition. Call light in hand.  And alarm set.   Therapy Documentation Precautions:  Precautions Precautions: Fall Restrictions Weight Bearing Restrictions: Yes LUE Weight Bearing: Weight bearing as tolerated RLE Weight Bearing: Touchdown weight bearing Other Position/Activity Restrictions: RLE TDWB, LUE WBAT General:   Vital Signs: Therapy Vitals Temp: 98.4 F (36.9 C) Temp Source: Oral Pulse Rate: 79 Resp: 18 BP: 131/75 Patient Position (if appropriate): Lying Oxygen Therapy SpO2: 99 % Pain:   Mobility:   Locomotion :    Trunk/Postural Assessment :    Balance:   Exercises:   Other Treatments:      Therapy/Group: Individual Therapy  Barbaraann Faster 02/17/2021, 3:47 PM

## 2021-02-17 NOTE — Progress Notes (Signed)
Occupational Therapy Session Note  Patient Details  Name: Yonael Tulloch MRN: [id number] Date of Birth: 08-15-1966  Today's Date: 02/17/2021 OT Individual Time: 0915-1000 OT Individual Time Calculation (min): 45 min   Short Term Goals: Week 1:  OT Short Term Goal 1 (Week 1): Pt will perform BSC/toilet transfers w/ AD with Min A OT Short Term Goal 2 (Week 1): Pt will perform LB dressing w/ AE no more than Min A OT Short Term Goal 3 (Week 1): Pt will perform UB/LB bathing with no more than Min A  Skilled Therapeutic Interventions/Progress Updates:    Pt greeted in the w/c, premedicated for Rt LE pain. He reported fatigue and had just returned from previous therapy. Pt agreeable to seated tx focusing on AE training to increase his functional independence with LB self care. His spouse, Zoelle, was present to observe. Taught pt how to use the reacher, sock aide, long handled shoe horn, and shoe funnel. Pt able to doff/don gripper socks with supervision using AE. Increased difficultly with donning his sneakers, pt needing to use the shoe funnel to don his Rt shoe, able to use the shoe horn for the Lt. Shoes were donned with Min A for adjusting laces only. Pt very pleased by his ability to incorporate his affected Rt side, needing increased time to meet task demands but very motivated. At end of session pt remained sitting up in the w/c, all needs within reach.   Therapy Documentation Precautions:  Precautions Precautions: Fall Restrictions Weight Bearing Restrictions: Yes LUE Weight Bearing: Weight bearing as tolerated RLE Weight Bearing: Touchdown weight bearing Other Position/Activity Restrictions: RLE TDWB, LUE WBAT ADL: ADL Eating: Not assessed Grooming: Not assessed Upper Body Bathing: Setup Lower Body Bathing: Moderate assistance Where Assessed-Lower Body Bathing: Sitting at sink Upper Body Dressing: Minimal assistance Lower Body Dressing: Maximal assistance Toileting: Maximal  assistance Toilet Transfer: Moderate assistance Toilet Transfer Method: Stand pivot      Therapy/Group: Individual Therapy  Timiyah Romito A Myiesha Edgar 02/17/2021, 12:39 PM
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
## 2021-02-17 NOTE — Progress Notes (Signed)
Physical Therapy Session Note  Patient Details  Name: [name] MRN: [id number] Date of Birth: 03-04-1966  Today's Date: 02/17/2021 PT Individual Time: 1003-1100 PT Individual Time Calculation (min): 57 min   Short Term Goals: Week 1:  PT Short Term Goal 1 (Week 1): pt to demonstrate supine<>sit min A PT Short Term Goal 2 (Week 1): pt to demonstrate functional transfers within [facility] precautions with LRAD at min A PT Short Term Goal 3 (Week 1): pt to demonstrate ability to hop/gait train within WB status for 10' min A PT Short Term Goal 4 (Week 1): pt to demonstrate tolerance to sitting OOB 3 hours  Skilled Therapeutic Interventions/Progress Updates: Pt presents sitting in w/c and agreeable to therapy.  Pt required rest as just returned from therapy.  Abd binder donned total A.  Pt wheeled to Dayroom for time conservation.  Pt performed LE there ex w/ AAROM to RLE for calf raises, LAQ, hip flexion, isometric add and GS 3 x 10.  Pt requires seated rest break between sets 2/ 2 pain.  Pt transferred sit to stand w/ min A and verbal cues for breathing technique and sequencing.  Pt stood x 5' w/ active knee extension to bring R heel to floor, although unable to complete full extension.  Pt required seated rest break and then amb 13' w/ RW and mod A.  Pt required verbal cues for sequencing and maintaining R foot on ground or pt swings through.  Pt stood x 2 during this gait distance.  Pt returned to room and remained in w/c w/ chair alarm on and all needs in reach. Spouse present at beginning and conclusion of rx.     Therapy Documentation Precautions:  Precautions Precautions: Fall Restrictions Weight Bearing Restrictions: Yes LUE Weight Bearing: Weight bearing as tolerated RLE Weight Bearing: Touchdown weight bearing Other Position/Activity Restrictions: RLE TDWB, LUE WBAT General:   Vital Signs:   Pain: 7/10 to R thigh/hip Pain Assessment Pain Score: 2  Mobility:       Therapy/Group: Individual Therapy  [name] 02/17/2021, 12:09 PM

## 2021-02-18 DIAGNOSIS — G479 Sleep disorder, unspecified: Secondary | ICD-10-CM

## 2021-02-18 MED ORDER — SORBITOL 70 % SOLN: 30.0000 mL | Freq: Every day | Status: DC | PRN

## 2021-02-18 MED ORDER — SORBITOL 70 % SOLN
60.0000 mL | Freq: Once | Status: AC
  Administered 2021-02-18: 60 mL via ORAL
  Filled 2021-02-18: qty 60
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
## 2021-02-19 ENCOUNTER — Inpatient Hospital Stay (HOSPITAL_COMMUNITY): Payer: Self-pay
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
## 2021-02-19 NOTE — Progress Notes (Signed)
Occupational Therapy Session Note  Patient Details  Name: [name] MRN: [id number] Date of Birth: 1966/06/18  Today's Date: 02/19/2021 OT Individual Time: 1450-1533 OT Individual Time Calculation (min): 43 min  and Today's Date: 02/19/2021 OT Group Time: 1105-1200 OT Group Time Calculation (min): 55 min  Skilled Therapeutic Interventions/Progress Updates:    Pt engaged in therapeutic w/c level dance group focusing on patient choice, UE/LE strengthening, salience, activity tolerance, and social participation. Pt was guided through various dance-based exercises involving UEs/LEs and trunk. All music was selected by group members. Emphasis placed on general strengthening and endurance while adhering to medical precautions. Pt did very well with participation during group, especially when his wife [name] arrived. Pt actively working on mobility of his Lt arm, also the Rt LE per pain tolerance. At end of session pts wife assisted pt back to the room via w/c.   2nd Session 1:1 tx (43 min) Pt greeted in bed, agreeable to session, declined toileting or brushing his teeth. Worked on bed mobility with HOB flat, bedrail removed per setup at home. Supervision/cues for supine<sit, pt rising up on the Lt elbow. Due to pt having a low bed, worked on sit<stands from lower surface, vcs for hand/foot placement to increase safety and ease of adherence to Rt LE TDWB precautions. Pt completed 3 sit<stands, 1st with Min A, 2nd with CGA, and 3rd with close supervision assist! We celebrated! Pt then scooted up towards HOB, used modified gait belt to raise his Rt LE back into the bed, once again bedrail removed. Pt able to meet task demands with setup assist and increased time. Pt remained comfortably in bed with Rt LE elevated, all needs within reach and bed alarm set.   Therapy Documentation Precautions:  Precautions Precautions: Fall Restrictions Weight Bearing Restrictions: Yes LUE Weight Bearing: Weight  bearing as tolerated RLE Weight Bearing: Touchdown weight bearing LLE Weight Bearing: Non weight bearing Other Position/Activity Restrictions: RLE TDWB, LUE WBAT Vital Signs: Therapy Vitals Temp: 99.7 F (37.6 C) Temp Source: Oral Pulse Rate: 77 Resp: 17 BP: 126/80 Patient Position (if appropriate): Lying Oxygen Therapy SpO2: 100 % O2 Device: Room Air Pain: pt independently modified exercises today to accommodate Rt LE pain/mobility limitations; during 2nd session, RN was in to provide pain medicine. Pt opted to take his medicine in standing to work on standing balance  Pain Assessment Pain Scale: 0-10 Pain Score: 7  Pain Type: Acute pain Pain Location: Knee Pain Orientation: Right Pain Descriptors / Indicators: Aching Pain Frequency: Intermittent Pain Onset: On-going Patients Stated Pain Goal: 0 Pain Intervention(s): Medication (See eMAR) ADL: ADL Eating: Not assessed Grooming: Not assessed Upper Body Bathing: Setup Lower Body Bathing: Moderate assistance Where Assessed-Lower Body Bathing: Sitting at sink Upper Body Dressing: Minimal assistance Lower Body Dressing: Maximal assistance Toileting: Maximal assistance Toilet Transfer: Moderate assistance Toilet Transfer Method: Stand pivot      Therapy/Group: Individual Therapy and Group Therapy  Curran Lenderman A Archit Leger 02/19/2021, 3:55 PM

## 2021-02-20 DIAGNOSIS — M25561 Pain in right knee: Secondary | ICD-10-CM

## 2021-02-20 DIAGNOSIS — M79604 Pain in right leg: Secondary | ICD-10-CM

## 2021-02-20 DIAGNOSIS — M79605 Pain in left leg: Secondary | ICD-10-CM

## 2021-02-20 NOTE — Progress Notes (Signed)
Ice applied to right knee 3 times this shift. PRN tylenol given at 2009, with relief. [name]

## 2021-02-20 NOTE — Progress Notes (Signed)
Physical Therapy Session Note  Patient Details  Name: [name] MRN: [id number] Date of Birth: 03-Oct-1965  Today's Date: 02/20/2021 PT Individual Time: 1135-1205 PT Individual Time Calculation (min): 30 min   Short Term Goals: Week 1:  PT Short Term Goal 1 (Week 1): pt to demonstrate supine<>sit min A PT Short Term Goal 2 (Week 1): pt to demonstrate functional transfers within [facility] precautions with LRAD at min A PT Short Term Goal 3 (Week 1): pt to demonstrate ability to hop/gait train within WB status for 10' min A PT Short Term Goal 4 (Week 1): pt to demonstrate tolerance to sitting OOB 3 hours  Skilled Therapeutic Interventions/Progress Updates:   Pt received sitting in WC and agreeable to PT. WC mobility in hall x 117f. And over cement sidewalk x 1287f Min assist from PT on unlevel grade to prevent running into curb and maintain progress on up hill grade.   Blocked practice Sit<>stand transfer training with min assist progressing to CGA x 5 with UE support on RW and cues to [facility]. Prolonged rest break in standing for each bout.   Patient returned to room and [facility] reach and all needs met.         Therapy Documentation Precautions:  Precautions Precautions: Fall Restrictions Weight Bearing Restrictions: Yes LUE Weight Bearing: Weight bearing as tolerated RLE Weight Bearing: Touchdown weight bearing LLE Weight Bearing: Non weight bearing Other Position/Activity Restrictions: RLE TDWB, LUE WBAT    Vital Signs: Therapy Vitals Temp: 99 F (37.2 C) Pulse Rate: 91 Resp: 16 BP: 128/76 Patient Position (if appropriate): Sitting Oxygen Therapy SpO2: 99 % O2 Device: Room Air Pain: Pain Assessment Pain Scale: 0-10 Pain Score: 8  Pain Type: Acute pain;Surgical pain Pain Location: Knee Pain Orientation: Right Pain Descriptors / Indicators: Aching Pain Frequency: Intermittent Pain Onset: On-going Patients Stated  Pain Goal: 0 Pain Intervention(s): Medication (See eMAR)    Therapy/Group: Individual Therapy  AuLorie Phenix/30/2022, 2:08 PM

## 2021-02-20 NOTE — Progress Notes (Signed)
Occupational Therapy Session Note  Patient Details  Name: [name] MRN: [id number] Date of Birth: [birth year]  Today's Date: 02/20/2021 OT Individual Time: 4144-3601 OT Individual Time Calculation (min): 32 min    Short Term Goals: Week 1:  OT Short Term Goal 1 (Week 1): Pt will perform BSC/toilet transfers w/ AD with Min A OT Short Term Goal 2 (Week 1): Pt will perform LB dressing w/ AE no more than Min A OT Short Term Goal 3 (Week 1): Pt will perform UB/LB bathing with no more than Min A   Skilled Therapeutic Interventions/Progress Updates:    Pt greeted at time of session sitting up in wheelchair agreeable to OT session, no pain resting but some R knee pain with ambulation, MD aware and per pt is getting MRI. Pt self propel partially to gym, therapist resuming remaining distance. In gym, BITS x1 trial in standing with bending/reaching for hitting objects in order and no LOB. Pt ambulated approx 30-40 feet in hall with RW adhering to WB status and close wheelchair follow, extended time required. Pt up in wheelchair in room with alarm on call bell in reach all needs met.   Therapy Documentation Precautions:  Precautions Precautions: Fall Restrictions Weight Bearing Restrictions: Yes LUE Weight Bearing: Weight bearing as tolerated RLE Weight Bearing: Touchdown weight bearing LLE Weight Bearing: Non weight bearing Other Position/Activity Restrictions: RLE TDWB, LUE WBAT    Therapy/Group: Individual Therapy  Viona Gilmore 02/20/2021, 7:15 AM
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
## 2021-02-20 NOTE — Progress Notes (Signed)
Physical Therapy Session Note  Patient Details  Name: [name] MRN: [id number] Date of Birth: 1966/06/17  Today's Date: 02/20/2021 PT Individual Time: 0830-0915 PT Individual Time Calculation (min): 45 min   Short Term Goals: Week 1:  PT Short Term Goal 1 (Week 1): pt to demonstrate supine<>sit min A PT Short Term Goal 2 (Week 1): pt to demonstrate functional transfers within [facility] precautions with LRAD at min A PT Short Term Goal 3 (Week 1): pt to demonstrate ability to hop/gait train within WB status for 10' min A PT Short Term Goal 4 (Week 1): pt to demonstrate tolerance to sitting OOB 3 hours  Skilled Therapeutic Interventions/Progress Updates:    Patient received sitting up in bed, MD present for AM rounds. He continues to report pain in R knee, especially with flexion, but did not rate-premedicated. PT providing rest breaks, distractions and repositioning to assist with pain management. He was able to come sit edge of bed with supervision, managing R LE himself, with HOB elevated. Patient completing stand pivot to wc with RW and MinA from raised bed height. Patient propelling himself in wc 2x169ft with supervision. Decreased speed and intermittent rest breaks due to L UE fatigue, but no pain. Patient completing the following therex: LAQ (5# ankle weight L LE only) 2x12, chest press, bicep curl 4# dowel 2x12 each. Patient requiring frequent rest breaks for UE therex as well due to fatigue and pain. Patient discussing scar management with patient and the importance of scar mobilization to assist in restoring skin elasticity. Patient receptive to education. Patient returning to his room in wc, chair alarm on, call light within reach.   Therapy Documentation Precautions:  Precautions Precautions: Fall Restrictions Weight Bearing Restrictions: Yes LUE Weight Bearing: Weight bearing as tolerated RLE Weight Bearing: Touchdown weight bearing LLE Weight Bearing: Non weight bearing Other  Position/Activity Restrictions: RLE TDWB, LUE WBAT    Therapy/Group: Individual Therapy  Elizebeth Koller, PT, DPT, CBIS  02/20/2021, 7:54 AM

## 2021-02-20 NOTE — Progress Notes (Signed)
Physical Therapy Session Note  Patient Details  Name: [name] MRN: [id number] Date of Birth: 01-Jun-1966  Today's Date: 02/20/2021 PT Individual Time: 6378-5885 PT Individual Time Calculation (min): 59 min   Short Term Goals: Week 1:  PT Short Term Goal 1 (Week 1): pt to demonstrate supine<>sit min A PT Short Term Goal 2 (Week 1): pt to demonstrate functional transfers within [facility] precautions with LRAD at min A PT Short Term Goal 3 (Week 1): pt to demonstrate ability to hop/gait train within WB status for 10' min A PT Short Term Goal 4 (Week 1): pt to demonstrate tolerance to sitting OOB 3 hours  Skilled Therapeutic Interventions/Progress Updates:    Pt received sitting in w/c with his wife present and pt agreeable to therapy session. Pt able to recall R LE and L UE WBing restrictions with min cuing for clarification and implementation into mobility.  B UE w/c propulsion ~141ft x2 to main therapy gym - requires 1x seated rest break due to L UE fatigue and "stiffness" - demos adequate propulsion technique though slow speed. Pt able to set-up w/c for transfer to mat including locking brakes and doffing w/c leg rests with min cuing. L stand pivot w/c>EOM using RW with CGA for steadying coming to stand and while turning - pt demos excellent ability to maintain R LE TDWBing during transfers throughout session. Therapy session focused on gentle AROM of R hip and knee via the following exercises with frequent rest breaks, slow movements, and distraction for pain management.  Performed the following R LE seated exercises:  - long arc quads x5 reps, x10 reps with external target to achieve terminal extension - sustained long arc quad/knee extension during ankle PF/DF AROM x10 reps  - knee flexion AROM/heel slides with towel on floor to decrease friction x10 reps with slow movement only within pt's pain tolerance   - achieved 92degrees of seated knee flexion AROM  Performed the following standing  R LE exercises using BUE support on RW for balance with close supervision/CGA for safety:  - hip flexion x8 reps, x10 reps  - hip extension x5reps, x10 reps - pt demos gradually increased hip extension ROM though reports feeling tightness in groin/hip flexor  Educated pt on importance of providing gentle stretch to R hip flexors and encouraged pt to having nursing staff assist him with standing to use the urinal during the day or having them assist him to the bathroom to decrease prolonged hip flexed posturing.  Throughout session pt completed sit<>stand transfers EOM<>RW with CGA for safety. Transported back to room and pt agreeable to remain sitting in w/c for meal - left with needs in reach, wife present, chair alarm on, ice pack provided for R knee, and NT present.    Therapy Documentation Precautions:  Precautions Precautions: Fall Restrictions Weight Bearing Restrictions: Yes LUE Weight Bearing: Weight bearing as tolerated RLE Weight Bearing: Touchdown weight bearing LLE Weight Bearing: Non weight bearing Other Position/Activity Restrictions: RLE TDWB, LUE WBAT  Pain:   Premedicated for R hip and knee pain as well as using ice upon therapist arrival. At end of session rates pain going from R knee to R groin as 6/10. Therapist provided ice bag at end of session for pain management as well as notifying RN for medication administration.    Therapy/Group: Individual Therapy  Ginny Forth , PT, DPT, CSRS  02/20/2021, 4:49 PM

## 2021-02-20 NOTE — Progress Notes (Signed)
Occupational Therapy Session Note  Patient Details  Name: Naseer Hearn MRN: [id number] Date of Birth: 16-Feb-1966  Today's Date: 02/20/2021 OT Individual Time: 6063-0160 OT Individual Time Calculation (min): 57 min   Short Term Goals: Week 1:  OT Short Term Goal 1 (Week 1): Pt will perform BSC/toilet transfers w/ AD with Min A OT Short Term Goal 2 (Week 1): Pt will perform LB dressing w/ AE no more than Min A OT Short Term Goal 3 (Week 1): Pt will perform UB/LB bathing with no more than Min A  Skilled Therapeutic Interventions/Progress Updates:    Pt greeted in the w/c, premedicated for pain. OT asked pt if he'd like to shower with pt wishing to defer shower until later this week. He wanted to go outdoors during session. Escorted him via w/c to the outdoor patio. Started by guiding pt through yoga-based and tai chi based ROM/stretching to work on functional return of the Lt UE and decreasing muscular tension in cervical/shoulder areas. UB strengthening and endurance by completing exercises using 2# bar x10 reps bicep curls, straight arm raises, forward/backward rows, and forward presses. Worked on coordinating breath with movement, instruction on technique. Sit<stand x3 with CGA using the RW. Pt doing well with his WB precautions during stands. Afterwards pt was returned to the room via w/c and remained sitting up, safety belt fastened, all needs within reach, and ice pack provided for the Rt LE.   Therapy Documentation Precautions:  Precautions Precautions: Fall Restrictions Weight Bearing Restrictions: Yes LUE Weight Bearing: Weight bearing as tolerated RLE Weight Bearing: Touchdown weight bearing LLE Weight Bearing: Non weight bearing Other Position/Activity Restrictions: RLE TDWB, LUE WBAT Vital Signs: Therapy Vitals Temp: 99 F (37.2 C) Pulse Rate: 91 Resp: 16 BP: 128/76 Patient Position (if appropriate): Sitting Oxygen Therapy SpO2: 99 % O2 Device: Room  Air ADL: ADL Eating: Not assessed Grooming: Not assessed Upper Body Bathing: Setup Lower Body Bathing: Moderate assistance Where Assessed-Lower Body Bathing: Sitting at sink Upper Body Dressing: Minimal assistance Lower Body Dressing: Maximal assistance Toileting: Maximal assistance Toilet Transfer: Moderate assistance Toilet Transfer Method: Stand pivot      Therapy/Group: Individual Therapy  Wajiha Versteeg A Kamla Skilton 02/20/2021, 3:42 PM

## 2021-02-21 ENCOUNTER — Inpatient Hospital Stay (HOSPITAL_COMMUNITY): Payer: Self-pay

## 2021-02-21 DIAGNOSIS — S83521A Sprain of posterior cruciate ligament of right knee, initial encounter: Secondary | ICD-10-CM

## 2021-02-21 DIAGNOSIS — R509 Fever, unspecified: Secondary | ICD-10-CM

## 2021-02-21 DIAGNOSIS — G8911 Acute pain due to trauma: Secondary | ICD-10-CM

## 2021-02-21 LAB — CBC WITH DIFFERENTIAL/PLATELET
Abs Immature Granulocytes: 0.34 10*3/uL — ABNORMAL HIGH (ref 0.00–0.07)
Basophils Absolute: 0.1 10*3/uL (ref 0.0–0.1)
Basophils Relative: 1 %
Eosinophils Absolute: 0 10*3/uL (ref 0.0–0.5)
Eosinophils Relative: 0 %
HCT: 30.5 % — ABNORMAL LOW (ref 39.0–52.0)
Hemoglobin: 10 g/dL — ABNORMAL LOW (ref 13.0–17.0)
Immature Granulocytes: 5 %
Lymphocytes Relative: 6 %
Lymphs Abs: 0.4 10*3/uL — ABNORMAL LOW (ref 0.7–4.0)
MCH: 30.1 pg (ref 26.0–34.0)
MCHC: 32.8 g/dL (ref 30.0–36.0)
MCV: 91.9 fL (ref 80.0–100.0)
Monocytes Absolute: 0.8 10*3/uL (ref 0.1–1.0)
Monocytes Relative: 12 %
Neutro Abs: 5.2 10*3/uL (ref 1.7–7.7)
Neutrophils Relative %: 76 %
Platelets: 456 10*3/uL — ABNORMAL HIGH (ref 150–400)
RBC: 3.32 MIL/uL — ABNORMAL LOW (ref 4.22–5.81)
RDW: 13.8 % (ref 11.5–15.5)
WBC: 6.9 10*3/uL (ref 4.0–10.5)
nRBC: 0 % (ref 0.0–0.2)

## 2021-02-21 LAB — URINALYSIS, ROUTINE W REFLEX MICROSCOPIC
Bacteria, UA: NONE SEEN
Bilirubin Urine: NEGATIVE
Glucose, UA: NEGATIVE mg/dL
Ketones, ur: NEGATIVE mg/dL
Leukocytes,Ua: NEGATIVE
Nitrite: NEGATIVE
Protein, ur: NEGATIVE mg/dL
Specific Gravity, Urine: 1.024 (ref 1.005–1.030)
pH: 5 (ref 5.0–8.0)

## 2021-02-21 LAB — BASIC METABOLIC PANEL
Anion gap: 7 (ref 5–15)
BUN: 11 mg/dL (ref 6–20)
CO2: 28 mmol/L (ref 22–32)
Calcium: 9.2 mg/dL (ref 8.9–10.3)
Chloride: 96 mmol/L — ABNORMAL LOW (ref 98–111)
Creatinine, Ser: 1.12 mg/dL (ref 0.61–1.24)
GFR, Estimated: >60 mL/min (ref >60)
Glucose, Bld: 138 mg/dL — ABNORMAL HIGH (ref 70–99)
Potassium: 3.5 mmol/L (ref 3.5–5.1)
Sodium: 131 mmol/L — ABNORMAL LOW (ref 135–145)

## 2021-02-21 MED ORDER — POLYVINYL ALCOHOL 1.4 % OP SOLN
1.0000 [drp] | OPHTHALMIC | Status: DC | PRN
  Administered 2021-02-22 – 2021-03-01 (×13): 1 [drp] via OPHTHALMIC
  Filled 2021-02-21: qty 15

## 2021-02-21 NOTE — Progress Notes (Signed)
Physical Therapy Session Note  Patient Details  Name: [name] MRN: [id number] Date of Birth: 06-15-1966  Today's Date: 02/21/2021 PT Individual Time: 1435-1535 PT Individual Time Calculation (min): 60 min   Short Term Goals: Week 1:  PT Short Term Goal 1 (Week 1): pt to demonstrate supine<>sit min A PT Short Term Goal 2 (Week 1): pt to demonstrate functional transfers within [facility] precautions with LRAD at min A PT Short Term Goal 3 (Week 1): pt to demonstrate ability to hop/gait train within WB status for 10' min A PT Short Term Goal 4 (Week 1): pt to demonstrate tolerance to sitting OOB 3 hours Week 2:    Week 3:     Skilled Therapeutic Interventions/Progress Updates:    Pain:  Pt reports  no pain due to admin of pain meds prior to session.  Treatment to tolerance.  Rest breaks and repositioning as needed.  Pt initially oob in wc and agreeable to treatment session w/focus on pt education, quad activation  Pt propels wc to gym mod I w/additional time. stand pivot transfer to mat w/cga TDWB maintained without difficulty, RW.  Pt reports "spinning" sensation, closes eyes until subsides. Short to long sit w/assist to manage RLE In longsitting, pt instructed w/proper alignment of beldsoe brace and therapist adjusted brace for proper fit.  Therex: Quad sets x 25 Single leg bridge w/therapist supporting RLE, pt uses LLE only x 20 Hip abd AAROM x 15 Gentle active hip IR/ER w/legs fully extended  Soft tissue mob of proximal IT Band, hip flexor to tolerance to promote decreased pain, increased hip extension due to tightness.  Supine to long sit w/mod assist.  Long to short sitting w/assist to manage RLE.  stand pivot transfer to wc w/cga.  wc propulsion 185ft mod I, additional time, transported remainder of distance to room for time management.  stand pivot transfer wc to bed w/RW w/cga.  Sit to supine w/assist to manage RLE.   Pt educated that Bledsoe could be removed when in bed  and therapist removed brace.  Pt left supine w/rails up x 4, alarm set, bed in lowest position, and needs in reach.  NT in room w/pt.  Documentation Precautions:  Precautions Precautions: Fall Restrictions Weight Bearing Restrictions: Yes LUE Weight Bearing: Weight bearing as tolerated RLE Weight Bearing: Touchdown weight bearing LLE Weight Bearing: Non weight bearing Other Position/Activity Restrictions: RLE TDWB, LUE WBAT    Therapy/Group: Individual Therapy  Shearon Balo 02/21/2021, 3:47 PM

## 2021-02-21 NOTE — Progress Notes (Signed)
Orthopedic Tech Progress Note Patient Details:  [name] 03-14-1966 [id number] Called in order to [employer] for a BLEDSOE KNEE BRACE Patient ID: [name], male   DOB: [birth year], 56 y.o.   MRN: [id number]   Donald Pore 02/21/2021, 1:10 PM

## 2021-02-21 NOTE — Progress Notes (Signed)
Occupational Therapy Session Note  Patient Details  Name: [name] MRN: [id number] Date of Birth: 11-Oct-1965  Today's Date: 02/21/2021 OT Individual Time: 3785-8850 and 2774-1287 OT Individual Time Calculation (min): 30 min and 58 min   Short Term Goals: Week 1:  OT Short Term Goal 1 (Week 1): Pt will perform BSC/toilet transfers w/ AD with Min A OT Short Term Goal 2 (Week 1): Pt will perform LB dressing w/ AE no more than Min A OT Short Term Goal 3 (Week 1): Pt will perform UB/LB bathing with no more than Min A   Skilled Therapeutic Interventions/Progress Updates:    Pt greeted at times of session sitting up in wheelchair with wife present, agreeable to OT session. No pain resting but significant amount of pain in R knee with mobilizing/ambulating, returned to sitting and ice packs reapplied. Discussion at beginning of session regarding home set up, stairs (to relay to PT), bathroom. Discussion with pt/wife regarding DME needs and problem solving. Pt sit <> stand and ambulate short distance to door with CGA close wheelchair follow. Set up in wheelchair with alarm on call bell in reach ice reapplied to R hip.   Session 2: Pt greeted at time of session sitting up in wheelchair agreeable to OT session. Self propel partially to hall but quickly fatigue, therapist resuming to transport to gym. In gym, focus on BUE AROM and strengthening with 4# dowel per pt request, bicep curl, chest press, overhead press initially 1x10 decreasing second set to 1x5 each with slow exaggerated movements, pt requiring extended time to complete. Transported to ortho gym and set up on SCIFIT level 3 initially decreasing to level 2 for 5 minutes total, 1 rest break at half way point. Note pt very fatigued and sleepy, falling asleep while doing SCIFIT which is not typical for this pt. BP 124/79, O2 98% and HR 91, all WNL despite intense fatigue, pt thinking it is 2/2 medication. Transported back to room, NT aware of  lethargy and will relay to RN. Pt with no pain at this time but c/o fatigue. Alarm on call bell in reach, wanting to sit up in chair instead of lay down.   Therapy Documentation Precautions:  Precautions Precautions: Fall Restrictions Weight Bearing Restrictions: Yes LUE Weight Bearing: Weight bearing as tolerated RLE Weight Bearing: Touchdown weight bearing LLE Weight Bearing: Non weight bearing Other Position/Activity Restrictions: RLE TDWB, LUE WBAT    Therapy/Group: Individual Therapy  [name] Score 02/21/2021, 7:22 AM

## 2021-02-21 NOTE — Progress Notes (Signed)
Pt returned from MRI VS checked pt is febrile. Denies any pain or SOB given tylenol. Will monitor

## 2021-02-21 NOTE — Progress Notes (Signed)
Patient ID: [name], male   DOB: 19-Jul-1966, 55 y.o.   MRN: [id number] Team Conference Report to Patient/Family  Team Conference discussion was reviewed with the patient and caregiver, including goals, any changes in plan of care and target discharge date.  Patient and caregiver express understanding and are in agreement.  The patient has a target discharge date of 03/02/21.  SW met with pt and spouse. Provided conference updates. Pt and spouse informed sw of pt new WB status. SW following up with PT for family on chances of pt going up 4 steps or requiring medical transport, sw will follow back up with family and wait on recommendations. Pt awaiting culture results. No other questions or concerns, sw will cont to follow up.  [name] 02/21/2021, 12:38 PM
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
## 2021-02-21 NOTE — Progress Notes (Signed)
Pt's fever is low grade after tylenol. Resting denies any c/o will continue to monitor

## 2021-02-21 NOTE — Progress Notes (Signed)
Physical Therapy Session Note  Patient Details  Name: [name] MRN: [id number] Date of Birth: 1966-02-27  Today's Date: 02/21/2021 PT Individual Time: 8937-3428 PT Individual Time Calculation (min): 48 min   Short Term Goals: Week 1:  PT Short Term Goal 1 (Week 1): pt to demonstrate supine<>sit min A PT Short Term Goal 2 (Week 1): pt to demonstrate functional transfers within [facility] precautions with LRAD at min A PT Short Term Goal 3 (Week 1): pt to demonstrate ability to hop/gait train within WB status for 10' min A PT Short Term Goal 4 (Week 1): pt to demonstrate tolerance to sitting OOB 3 hours  Skilled Therapeutic Interventions/Progress Updates:    Pt received seated in w/c in room, agreeable to PT session. Pt reports some pain in R hip and knee at rest that increases with mobility. Pt reports being premedicated prior to start of therapy session. Provided ice pack at end of session to R hip and knee for pain management. Manual w/c propulsion 2 x 100 ft with use of BUE at Supervision level before onset of fatigue. Sit to stand and stand pivot transfers with min A and RW throughout session, good adherence to [facility] precautions. Seated RLE therex x 10 reps: hip flex AAROM with pillowcase under heel, LAQ, mini-march, hip add squeeze. Pt reports urge to urinate at end of session. Taken into bathroom via w/c. Sit to stand with min A to RW. Pt maintains standing balance while urinating with close Supervision to CGA. Pt left seated in w/c in room with needs in reach, ice packs in place, wife present at end of session.  Therapy Documentation Precautions:  Precautions Precautions: Fall Restrictions Weight Bearing Restrictions: Yes LUE Weight Bearing: Weight bearing as tolerated RLE Weight Bearing: Touchdown weight bearing LLE Weight Bearing: Non weight bearing Other Position/Activity Restrictions: RLE TDWB, LUE WBAT   Therapy/Group: Individual Therapy   Peter Congo, PT, DPT,  CSRS  02/21/2021, 11:21 AM

## 2021-02-22 DIAGNOSIS — R52 Pain, unspecified: Secondary | ICD-10-CM

## 2021-02-22 LAB — URINE CULTURE: Culture: <10000 — AB

## 2021-02-22 NOTE — Progress Notes (Signed)
Occupational Therapy Session Note  Patient Details  Name: [name] MRN: [id number] Date of Birth: [birth year]  Today's Date: 02/22/2021 OT Individual Time: 2248-2500 OT Individual Time Calculation (min): 40 min    Short Term Goals: Week 1:  OT Short Term Goal 1 (Week 1): Pt will perform BSC/toilet transfers w/ AD with Min A OT Short Term Goal 2 (Week 1): Pt will perform LB dressing w/ AE no more than Min A OT Short Term Goal 3 (Week 1): Pt will perform UB/LB bathing with no more than Min A  Skilled Therapeutic Interventions/Progress Updates:    OT intervention with focus on dynamic standing balance to toss bean bags and clean bean bags using BUE, w/c mobility, BUE therex, and activity tolerance to increase independence with BADLs. Bean bag toss and cleaning bean bags with CGA. BUE therex with 4# bar-rowing 2x12 each direction with rest breaks. Pt returned to room and remained in w/c with all needs within reach. Seat alarm activated. Wife present. Pt c/o chills and RN Dauda notified.  Therapy Documentation Precautions:  Precautions Precautions: Fall Restrictions Weight Bearing Restrictions: Yes LUE Weight Bearing: Weight bearing as tolerated RLE Weight Bearing: Touchdown weight bearing LLE Weight Bearing: Non weight bearing Other Position/Activity Restrictions: RLE TDWB, LUE WBAT  Pain: Pt c/o 8/10 R knee pain during session; repositioned and ice applied when back in room  Therapy/Group: Individual Therapy  Rich Brave 02/22/2021, 12:07 PM
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
## 2021-02-22 NOTE — Progress Notes (Signed)
Occupational Therapy Session Note  Patient Details  Name: [name] MRN: [id number] Date of Birth: 1966-01-05  Today's Date: 02/22/2021 OT Individual Time: 1300-1359 OT Individual Time Calculation (min): 59 min    Short Term Goals: Week 1:  OT Short Term Goal 1 (Week 1): Pt will perform BSC/toilet transfers w/ AD with Min A OT Short Term Goal 2 (Week 1): Pt will perform LB dressing w/ AE no more than Min A OT Short Term Goal 3 (Week 1): Pt will perform UB/LB bathing with no more than Min A   Skilled Therapeutic Interventions/Progress Updates:    Pt greeted at time of session sitting up in wheelchair agreeable to OT session for shower. LUE, R hip and knee abrasions all covered with appropriate tegaderm for water proofing. Stand pivot wheelchair > TTB in shower with CGA/Min and RW, doffed shorts in standing Min A. UB/LB bathing Min A with therapist providing distant supervision and wife performing close supervision throughout d/t pt shyness with showering/nudity. Pt using LHS for BLEs and back, encouraged lateral leans for buttocks on large bench. Dried off same manner except Min A for BLEs past knee level. Donned shirt set up, shorts Min/Mod for threading and donning over hips in standing with CGA. Dependently donned R knee brace past knee level and pt strapping first two. Note pt with some anxiety during shower, providing step by step instructions and letting the pt know what was going to happen prior. Pt did not wet face/head d/t abrasions. Stand pivot back to wheelchair CGA/Min with RW. Set up in wheelchair for neuropyshc, alarm on call bell in reach.     Therapy Documentation Precautions:  Precautions Precautions: Fall Restrictions Weight Bearing Restrictions: Yes LUE Weight Bearing: Weight bearing as tolerated RLE Weight Bearing: Touchdown weight bearing LLE Weight Bearing: Non weight bearing Other Position/Activity Restrictions: RLE TDWB, LUE WBAT    Therapy/Group:  Individual Therapy  [name] Score 02/22/2021, 7:29 AM
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
## 2021-02-22 NOTE — Progress Notes (Signed)
Orthopaedic Trauma Progress Note  SUBJECTIVE: Doing fairly well today, pain controlled on current regimen. MRI right knee performed yesterday showed complete PCL tear and MCL sprain, was placed in a hinge knee brace and plan will be for patient to follow-up with Dr. Eulah Pont on an outpatient basis at discharge.  Patient and his wife who is at bedside today do not feel that his hinge knee brace fits extremely well.  They are asking for someone to come evaluate and adjust the fit when he is working with therapies tomorrow. Patient is interested in getting surgery on PCL as soon as possible as he is very active at baseline. Tentative discharge date is 03/02/21.   OBJECTIVE:  Vitals:   02/21/21 1946 02/22/21 0458  BP: 138/78 117/67  Pulse: 78 78  Resp: 18 17  Temp: 98.9 F (37.2 C) 98.9 F (37.2 C)  SpO2: 99% 100%    General: Resting in bed, NAD Respiratory: No increased work of breathing.  LUE: Incision CDI. Able to wiggle fingers. Excellent shoulder and elbow motion. Hand warm and well perfused. +radial pulse RLE: Incision CDI. Mild tenderness over hip. Ankle motion intact. Neurovascularly intact.   IMAGING: Stable post op imaging of left humerus and right acetabulum.  Plan for repeat imaging 02/24/2021  ASSESSMENT: [name] is a 55 y.o. male, 12 days post-op S/P 1. OPEN REDUCTION INTERNAL FIXATION (ORIF) HUMERAL SHAFT FRACTURE 2. OPEN REDUCTION INTERNAL FIXATION (ORIF) ACETABULAR FRACTURE  CV/Blood loss: Hgb 10.0, stable.   PLAN: Weightbearing: WBAT LLE , TDWB RLE Incisional and dressing care: Change PRN. Ok to leave open to air Showering: OK to shower and get incisions wet Orthopedic device(s): Sling for comfort LUE. Hinge knee brace RLE Pain management: Continue current regimen per CIR VTE prophylaxis: Lovenox Impediments to Fracture Healing: Polytrauma.  Vitamin D level 13, continue supplementation Dispo: Continue care per CIR. Would recommend Eliquis x 14 days at discharge  for DVT prophylaxis.  Would likely hold off on steroid injection of right knee for now as this may delay timing for surgical intervention of right PCL.  Follow - up plan: We will follow while in hospital, plan for outpatient follow-up 2 weeks after discharge for repeat imaging and wound check R humerus L acetabulum  Contact information:  Truitt Merle MD, Ulyses Southward PA-C. After hours and holidays please check [email or web address] for group call information for [facility]   Clemie General A. Michaelyn Barter, PA-C [phone] (office) [email or web address]

## 2021-02-22 NOTE — Progress Notes (Signed)
Occupational Therapy Session Note  Patient Details  Name: [name] MRN: [id number] Date of Birth: 12/27/1965  Today's Date: 02/22/2021 OT Individual Time: 0930-1030 OT Individual Time Calculation (min): 60 min    Short Term Goals: Week 1:  OT Short Term Goal 1 (Week 1): Pt will perform BSC/toilet transfers w/ AD with Min A OT Short Term Goal 2 (Week 1): Pt will perform LB dressing w/ AE no more than Min A OT Short Term Goal 3 (Week 1): Pt will perform UB/LB bathing with no more than Min A  Skilled Therapeutic Interventions/Progress Updates:    OT intervention with focus on w/c mobility, BUE therex on SciFit, activity tolerance, TTB transfers, and discharge planning. Pt declined shower this morning or changing clothing. Pt propelled w/c to tub room with 3 rest breaks. Pt practiced TTB transfers with CGA X 2. SciFit 7 mins random level 5 in each direction. Pt required rest break during each segment. Pt fatigues quickly and requires multiple rest breaks. Pt returned to room and remained in w/c with all needs within reach. Seat alarm activated.   Therapy Documentation Precautions:  Precautions Precautions: Fall Restrictions Weight Bearing Restrictions: Yes LUE Weight Bearing: Weight bearing as tolerated RLE Weight Bearing: Touchdown weight bearing LLE Weight Bearing: Non weight bearing Other Position/Activity Restrictions: RLE TDWB, LUE WBAT   Pain: Pt states his pain is "ok" but he fatigues quickly with activity  Therapy/Group: Individual Therapy  Rich Brave 02/22/2021, 10:31 AM

## 2021-02-22 NOTE — Progress Notes (Signed)
Physical Therapy Session Note  Patient Details  Name: [name] MRN: [id number] Date of Birth: 24-Jan-1966  Today's Date: 02/22/2021 PT Individual Time: 0800-0900 PT Individual Time Calculation (min): 60 min   Short Term Goals: Week 1:  PT Short Term Goal 1 (Week 1): pt to demonstrate supine<>sit min A PT Short Term Goal 2 (Week 1): pt to demonstrate functional transfers within [facility] precautions with LRAD at min A PT Short Term Goal 3 (Week 1): pt to demonstrate ability to hop/gait train within WB status for 10' min A PT Short Term Goal 4 (Week 1): pt to demonstrate tolerance to sitting OOB 3 hours  Skilled Therapeutic Interventions/Progress Updates:    Pt received supine in bed, agreeable to PT session. Pt reports pain in R knee at rest that increases with mobility as well as onset of N/T sensation in knee. Pt premedicated prior to start of therapy session and provided ice packs at end of session for R knee and R hip. Assisted pt with donning Bledsoe brace on RLE while supine in bed. Supine to sit with Supervision and use of bedrail. Assisted pt with donning L sock and tennis shoe while seated EOB. Sit to stand and stand pivot transfer with RW and CGA to w/c. Manual w/c propulsion x 200 ft with use of BUE at Supervision level with increased time and several rest breaks needed. Ambulation x 40 ft, 2 x 20 ft with RW and CGA for balance, cues for adherence to [facility] precautions. Pt reports onset of LLE fatigue during gait. Demonstrated how to navigate stairs via shower chair method, pt reluctant to attempt at this time. Pt reports preferring to take medical transport home upon d/c from hospital. Discussed how this is an option but it is also important to work towards being able to navigate stairs safely in case of emergency at home and in order to be able to leave the house safely for appointments, etc. Seated BUE strengthening task performing 5# dowel rod volleyball, 2 x 10 reps to fatigue. Pt left  seated in w/c in room with needs in reach, chair alarm in place at end of session.  Therapy Documentation Precautions:  Precautions Precautions: Fall Restrictions Weight Bearing Restrictions: Yes LUE Weight Bearing: Weight bearing as tolerated RLE Weight Bearing: Touchdown weight bearing LLE Weight Bearing: Non weight bearing Other Position/Activity Restrictions: RLE TDWB, LUE WBAT   Therapy/Group: Individual Therapy   Peter Congo, PT, DPT, CSRS  02/22/2021, 11:57 AM

## 2021-02-22 NOTE — Progress Notes (Signed)
Patient ID: [name], male   DOB: 01-May-1966, 55 y.o.   MRN: [id number]  PT TTB, RW, and BSC ordered through [employer]. Pt already has a wheelchair  Lavera Guise, [place] [phone]

## 2021-02-23 LAB — CBC WITH DIFFERENTIAL/PLATELET
Abs Immature Granulocytes: 0.1 10*3/uL — ABNORMAL HIGH (ref 0.00–0.07)
Basophils Absolute: 0 10*3/uL (ref 0.0–0.1)
Basophils Relative: 1 %
Eosinophils Absolute: 0 10*3/uL (ref 0.0–0.5)
Eosinophils Relative: 1 %
HCT: 32.1 % — ABNORMAL LOW (ref 39.0–52.0)
Hemoglobin: 10.4 g/dL — ABNORMAL LOW (ref 13.0–17.0)
Immature Granulocytes: 3 %
Lymphocytes Relative: 22 %
Lymphs Abs: 0.8 10*3/uL (ref 0.7–4.0)
MCH: 29.7 pg (ref 26.0–34.0)
MCHC: 32.4 g/dL (ref 30.0–36.0)
MCV: 91.7 fL (ref 80.0–100.0)
Monocytes Absolute: 0.4 10*3/uL (ref 0.1–1.0)
Monocytes Relative: 10 %
Neutro Abs: 2.2 10*3/uL (ref 1.7–7.7)
Neutrophils Relative %: 63 %
Platelets: 470 10*3/uL — ABNORMAL HIGH (ref 150–400)
RBC: 3.5 MIL/uL — ABNORMAL LOW (ref 4.22–5.81)
RDW: 13.7 % (ref 11.5–15.5)
WBC: 3.5 10*3/uL — ABNORMAL LOW (ref 4.0–10.5)
nRBC: 0 % (ref 0.0–0.2)

## 2021-02-23 LAB — BASIC METABOLIC PANEL
Anion gap: 8 (ref 5–15)
BUN: 14 mg/dL (ref 6–20)
CO2: 30 mmol/L (ref 22–32)
Calcium: 9.2 mg/dL (ref 8.9–10.3)
Chloride: 92 mmol/L — ABNORMAL LOW (ref 98–111)
Creatinine, Ser: 1.14 mg/dL (ref 0.61–1.24)
GFR, Estimated: >60 mL/min (ref >60)
Glucose, Bld: 101 mg/dL — ABNORMAL HIGH (ref 70–99)
Potassium: 4.2 mmol/L (ref 3.5–5.1)
Sodium: 130 mmol/L — ABNORMAL LOW (ref 135–145)
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
## 2021-02-23 NOTE — Progress Notes (Signed)
Patient ID: [name], male   DOB: [birth year], 54 y.o.   MRN: [id number]   Hospital follow up scheduled with [facility] on April 04, 2021 at 2:30 PM with Loreen Freud, NP  Lavera Guise, [place] [phone]

## 2021-02-23 NOTE — Progress Notes (Signed)
Patient ID: [name], male   DOB: 1965-12-20, 55 y.o.   MRN: [id number]   SW submitted pt referral for [facility], SW will follow up with determination.  [place], [place] [phone]

## 2021-02-23 NOTE — Progress Notes (Signed)
Occupational Therapy Weekly Progress Note  Patient Details  Name: [name] MRN: [id number] Date of Birth: 05-22-1966  Beginning of progress report period: Feb 16, 2021 End of progress report period: February 23, 2021  Today's Date: 02/23/2021 OT Individual Time: 1601-1630 OT Individual Time Calculation (min): 29 min    Patient has met 2 of 3 short term goals.  Pt has progressed toward OT goals and is performing ADL transfers to toilet/BSC, bed, chair, etc. With CGA with RW with excellent adherence to WB precautions for RLE. Pt showered on 6/1 and performed with Min A with techniques and AE. Wife present for most sessions, education and training on going. Pt has been significantly limited by R knee pain, MRI showed multiple ligamental and muscle tears/strains, surgery upcoming but plan/date TBD. Pt is progressing steadily with OT goals, continue POC.   Patient continues to demonstrate the following deficits: muscle weakness, decreased cardiorespiratoy endurance, impaired timing and sequencing, decreased coordination and decreased motor planning and decreased sitting balance, decreased standing balance, decreased postural control and decreased balance strategies and therefore will continue to benefit from skilled OT intervention to enhance overall performance with BADL, iADL and Reduce care partner burden.  Patient progressing toward long term goals..  Continue plan of care.  OT Short Term Goals Week 1:  OT Short Term Goal 1 (Week 1): Pt will perform BSC/toilet transfers w/ AD with Min A OT Short Term Goal 1 - Progress (Week 1): Met OT Short Term Goal 2 (Week 1): Pt will perform LB dressing w/ AE no more than Min A OT Short Term Goal 2 - Progress (Week 1): Progressing toward goal OT Short Term Goal 3 (Week 1): Pt will perform UB/LB bathing with no more than Min A OT Short Term Goal 3 - Progress (Week 1): Met Week 2:  OT Short Term Goal 1 (Week 2): STGs = LTGs at Supervision  Skilled  Therapeutic Interventions/Progress Updates:    Pt greeted at time of session sitting up in wheelchair agreeable to OT session, stating he had intense pain in R knee earlier today with standing/mobility and planned to do more seated/core work for this session. Pt self propel partially to gym with Supervision with BUE propulsion, therapist resuming with fatigue. 2x20 rebounder throws with lightweight ball for throw and throw + overhead press, 2x15 throws with 2kg weighted ball for throw and throw + overhead press. Transported back to room and set up with call bell in reach all needs met.   Therapy Documentation Precautions:  Precautions Precautions: Fall Precaution Comments: sling for comfort Restrictions Weight Bearing Restrictions: No LUE Weight Bearing: Weight bearing as tolerated RLE Weight Bearing: Touchdown weight bearing LLE Weight Bearing: Non weight bearing Other Position/Activity Restrictions: RLE TDWB, LUE WBAT    Therapy/Group: Individual Therapy  Viona Gilmore 02/23/2021, 2:06 PM

## 2021-02-23 NOTE — Progress Notes (Signed)
Occupational Therapy Session Note  Patient Details  Name: [name] MRN: [id number] Date of Birth: 1966-08-04  Today's Date: 02/23/2021 OT Individual Time: 4854-6270 OT Individual Time Calculation (min): 56 min    Short Term Goals: Week 1:  OT Short Term Goal 1 (Week 1): Pt will perform BSC/toilet transfers w/ AD with Min A OT Short Term Goal 2 (Week 1): Pt will perform LB dressing w/ AE no more than Min A OT Short Term Goal 3 (Week 1): Pt will perform UB/LB bathing with no more than Min A  Skilled Therapeutic Interventions/Progress Updates:  Pt up in wheelchair to start with therapist in to complete vestibular testing.  He was able to transfer from the wheelchair to the bed with min assist stand pivot with the RW, adhering to his TDWBing.  He transitioned to supine with mod assist for lifting the RLE into the bed.  Slight dizziness reported with transition to supine.  Therapist completed testing of horizontal canals bilaterally with negative results.  Next, Gilberto Better was used to test the right and left anterior/posterior canals.  He exhibited upward torsional nystagmus with head turn to the left lasting for approximately 15 seconds.  Left ear was negative.  Completed Epley Maneuver for treatment of the left ear with resolution of dizziness noted and no nystagmus when retesting the right ear 3 times and the left once again.  No report of dizziness noted with transition back to sitting with mod assist for support of the RLE.  He completed sit to stand from the lowered bed at min assist level with transfer back to the wheelchair.  He was left sitting up with his spouse present and with ice on his right hip and knee for pain relief.  Call button and phone in reach at end of session.  Discussed letting therapist know if any positional changes cause further dizziness.    Therapy Documentation Precautions:  Precautions Precautions: Fall Precaution Comments: sling for  comfort Restrictions Weight Bearing Restrictions: No LUE Weight Bearing: Weight bearing as tolerated RLE Weight Bearing: Touchdown weight bearing LLE Weight Bearing: Non weight bearing Other Position/Activity Restrictions: RLE TDWB, LUE WBAT   Pain: Pain Assessment Pain Scale: Faces Faces Pain Scale: Hurts little more Pain Type: Acute pain Pain Location: Hip Pain Orientation: Left Pain Descriptors / Indicators: Discomfort Pain Onset: With Activity Pain Intervention(s): Repositioned ADL:  See Care Tool Section for some details of mobility and selfcare Therapy/Group: Individual Therapy  Kenzleigh Sedam OTR/L 02/23/2021, 12:32 PM

## 2021-02-23 NOTE — Progress Notes (Signed)
Physical Therapy Weekly Progress Note  Patient Details  Name: [name] MRN: [id number] Date of Birth: 04/05/1966  Beginning of progress report period: Feb 16, 2021 End of progress report period: February 23, 2021  Today's Date: 02/23/2021 PT Individual Time: 0900-1000 and 1300-1330 PT Individual Time Calculation (min): 60 min 30 mins  Patient has met 3 of 4 short term goals.  Pt has shown improvement toward all goals, continues to be limited by pain in Rt knee but motivated to participate and improve. Pt currently min A-CGA for bed mobility, min A-CGA transfers, min A gait for short distances, WC mobility CGA with BUE propulsion.   Patient continues to demonstrate the following deficits muscle weakness, decreased cardiorespiratoy endurance and decreased standing balance, decreased postural control and decreased balance strategies and therefore will continue to benefit from skilled PT intervention to increase functional independence with mobility.  Patient progressing toward long term goals..  Continue plan of care.  PT Short Term Goals Week 1:  PT Short Term Goal 1 (Week 1): pt to demonstrate supine<>sit min A PT Short Term Goal 1 - Progress (Week 1): Met PT Short Term Goal 2 (Week 1): pt to demonstrate functional transfers within [facility] precautions with LRAD at min A PT Short Term Goal 2 - Progress (Week 1): Met PT Short Term Goal 3 (Week 1): pt to demonstrate ability to hop/gait train within WB status for 10' min A PT Short Term Goal 3 - Progress (Week 1): Not met PT Short Term Goal 4 (Week 1): pt to demonstrate tolerance to sitting OOB 3 hours PT Short Term Goal 4 - Progress (Week 1): Met Week 2:  PT Short Term Goal 1 (Week 2): pt to demonstrate bed mobility CGA PT Short Term Goal 2 (Week 2): pt to demonstrate functional transfers with LRAD CGA PT Short Term Goal 3 (Week 2): pt to demonstrate gait 10' CGA with LRAD  Skilled Therapeutic Interventions/Progress Updates:    pt received  in bed and agreeable to therapy. Pt directed in supine>sit CGA with HOB slightly elevated and use of rail. Pt directed in Stand pivot transfer with Rolling walker CGA within WB status, taken to gym total A for time and energy. Pt directed in toe taps on 3" step with B handrails in standing x5 CGA with pt reporting increased pain, activity stopped. Pt directed in seated 2x10 5# marching, LAQ on LLE, 10# RUE, 3# arm bar for LUE bicep curls, chest press and overhead press. Pt directed in x5 Sit to stand from [facility] to Rolling walker CGA with standing at Rolling walker 30-45s each for improved standing tolerance and increased I with transfers. Pt unable to safely attempt stair training this session 2/2 pain and inability to hop up to step. Pt taken back to room in [facility] total A for time time and energy. Pt requested to remain in [facility], All needs in reach and in good condition. Call light in hand.    Session 2: pt received in [facility] and agreeable to therapy. Wife present. Pt directed in [facility] mobility for 75' supervision, then requested rest break. Pt taken in [facility] to gym rest of distance for time and energy. Pt directed in x4 Sit to stand to Rolling walker at [facility] within WB status. Pt directed in gait 10' x2 with Rolling walker CGA with good ability to remain in WB status and safely mobilize forward and backward as needed to increase safety with mobility into/out of bathroom at home as per pt WC will  not fit. Pt then directed in [facility] mobility to room  with one rest break supervision. Pt left in room, in [facility], wife present, All needs in reach and in good condition. Call light in hand.  And alarm set.   Therapy Documentation Precautions:  Precautions Precautions: Fall Precaution Comments: sling for comfort Restrictions Weight Bearing Restrictions: No LUE Weight Bearing: Weight bearing as tolerated RLE Weight Bearing: Touchdown weight bearing LLE Weight Bearing: Non weight bearing Other Position/Activity Restrictions: RLE TDWB, LUE  WBAT General:   Vital Signs:   Pain: Pain Assessment Pain Scale: 0-10 Pain Score: 2  Faces Pain Scale: Hurts little more Pain Type: Acute pain Pain Location: Hip Pain Orientation: Left Pain Descriptors / Indicators: Aching Pain Frequency: Intermittent Pain Onset: With Activity Pain Intervention(s): Medication (See eMAR) Vision/Perception     Mobility:   Locomotion :    Trunk/Postural Assessment :    Balance:   Exercises:   Other Treatments:     Therapy/Group: Individual Therapy  Junie Panning 02/23/2021, 12:43 PM

## 2021-02-23 NOTE — Progress Notes (Signed)
Physical Therapy Session Note  Patient Details  Name: [name] MRN: [id number] Date of Birth: [birth year]  Today's Date: 02/23/2021 PT Individual Time: 1425-1507 PT Individual Time Calculation (min): 42 min   Short Term Goals: Week 2:  PT Short Term Goal 1 (Week 2): pt to demonstrate bed mobility CGA PT Short Term Goal 2 (Week 2): pt to demonstrate functional transfers with LRAD CGA PT Short Term Goal 3 (Week 2): pt to demonstrate gait 10' CGA with LRAD  Skilled Therapeutic Interventions/Progress Updates:    Pt received sitting in w/c with his wife present and pt agreealbe to therapy session. Pt managing w/c brakes and leg rests during session with min cuing. B UE w/c propulsion ~265ft to main therapy gym with supervision - requires 1x seated rest break due to UE fatigue - requires increased time to problem solve how to back w/c up next to the mat table. Thearpist educated pt on proper positioning of Bledsoe brace as it was currently too low on his LE - assisted with readjusting brace while leg supported. Sit<>stands using RW with CGA progressing towards supervision for safety throughout session - pt able to maintain R LE TDWBing precautions. Gait training 18ft, 6ft using RW with CGA for safety - pt demoing good R LE TDWBing precaution and cuing for reminders as needed - step-to pattern leading with R LE and slow gait speed - requires 1x standing rest break during each gait trial due to increasing R LE pain. L stand pivot EOM>w/c using RW with CGA/supervision for safety. Pt reports he plans to go home via ambulance transport as he is not able to safely transfer in/out of his wife's low seated sports car and he needs to be on the main/1st floor of his house where there are 5STE and he states he cannot currently navigate stairs safely.  Transported back to room and left sitting in w/c with needs in reach, Bledsoe brace donned, and pt's wife present.   Therapy Documentation Precautions:   Precautions Precautions: Fall Precaution Comments: sling for comfort Restrictions Weight Bearing Restrictions: No LUE Weight Bearing: Weight bearing as tolerated RLE Weight Bearing: Touchdown weight bearing LLE Weight Bearing: Non weight bearing Other Position/Activity Restrictions: RLE TDWB, LUE WBAT  Pain: Reports R LE pain that increases in standing in dependent position but does not rate - provided standing or seated rest breaks for pain management and pt self sooths by rubbing his LE.    Therapy/Group: Individual Therapy  Ginny Forth , PT, DPT, CSRS  02/23/2021, 12:40 PM

## 2021-02-24 ENCOUNTER — Inpatient Hospital Stay (HOSPITAL_COMMUNITY): Payer: Self-pay

## 2021-02-24 NOTE — Progress Notes (Signed)
Physical Therapy Session Note  Patient Details  Name: [name] MRN: [id number] Date of Birth: 11-18-1965  Today's Date: 02/24/2021 PT Individual Time: 1650-1730 PT Individual Time Calculation (min): 40 min   Short Term Goals: Week 2:  PT Short Term Goal 1 (Week 2): pt to demonstrate bed mobility CGA PT Short Term Goal 2 (Week 2): pt to demonstrate functional transfers with LRAD CGA PT Short Term Goal 3 (Week 2): pt to demonstrate gait 10' CGA with LRAD   Skilled Therapeutic Interventions/Progress Updates:   Pt received sitting in WC and agreeable to PT. Pt transported to rehab gym in [facility]. Dynamic balance training while engaged in Wii sports bowling. Pt able to maintain standing x7 frames prior to needing seated rest break. Pt then performed tennis while sitting in WC x 2 games. WC mobility x 167f with supervision assist and cues for safety. Ambulatory transfer to toilet with supervision assist for urination. Patient returned to [facility] and [facility] and all needs met.         Therapy Documentation Precautions:  Precautions Precautions: Fall Precaution Comments: sling for comfort Restrictions Weight Bearing Restrictions: No LUE Weight Bearing: Weight bearing as tolerated RLE Weight Bearing: Touchdown weight bearing LLE Weight Bearing: Non weight bearing Other Position/Activity Restrictions: RLE TDWB, LUE WBAT    Vital Signs: Therapy Vitals Temp: 98.1 F (36.7 C) Pulse Rate: 75 Resp: 18 BP: 124/86 Patient Position (if appropriate): Sitting Oxygen Therapy SpO2: 99 % O2 Device: Room Air Pain:   denies  Therapy/Group: Individual Therapy  ALorie Phenix6/11/2020, 5:29 PM
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
## 2021-02-24 NOTE — Progress Notes (Signed)
Physical Therapy Session Note  Patient Details  Name: [name] MRN: [id number] Date of Birth: 1965/11/23  Today's Date: 02/24/2021 PT Individual Time: 0900-1000 PT Individual Time Calculation (min): 60 min   Short Term Goals: Week 1:  PT Short Term Goal 1 (Week 1): pt to demonstrate supine<>sit min A PT Short Term Goal 1 - Progress (Week 1): Met PT Short Term Goal 2 (Week 1): pt to demonstrate functional transfers within [facility] precautions with LRAD at min A PT Short Term Goal 2 - Progress (Week 1): Met PT Short Term Goal 3 (Week 1): pt to demonstrate ability to hop/gait train within WB status for 10' min A PT Short Term Goal 3 - Progress (Week 1): Not met PT Short Term Goal 4 (Week 1): pt to demonstrate tolerance to sitting OOB 3 hours PT Short Term Goal 4 - Progress (Week 1): Met Week 2:  PT Short Term Goal 1 (Week 2): pt to demonstrate bed mobility CGA PT Short Term Goal 2 (Week 2): pt to demonstrate functional transfers with LRAD CGA PT Short Term Goal 3 (Week 2): pt to demonstrate gait 10' CGA with LRAD  Skilled Therapeutic Interventions/Progress Updates:    pt received in [facility] and agreeable to therapy. Pt directed in [place] mobility 300' with supervision and one rest break. Pt directed off unit with nursing aware and agreeable. Pt then taken total A in [place] for time and energy. Pt directed in seated BUE strengthening with RUE 10# and LUE 3# for bicep curls, chest press, overhead press all 2x15. Rest breaks between sets 2/2 fatigue. Pt directed in x2 Sit to stand to Rolling walker CGA within WB status. Pt directed in [place] mobility back to unit supervision, x2 rest breaks with emphasis being on change of directions on and off uneven surfaces outside and mobility on level surfaces inside. Pt then taken final 100' total A for time and energy. Pt requested to be left in WC at end of session, All needs in reach and in good condition. Call light in hand.  And alarm set.   Therapy  Documentation Precautions:  Precautions Precautions: Fall Precaution Comments: sling for comfort Restrictions Weight Bearing Restrictions: No LUE Weight Bearing: Weight bearing as tolerated RLE Weight Bearing: Touchdown weight bearing LLE Weight Bearing: Non weight bearing Other Position/Activity Restrictions: RLE TDWB, LUE WBAT General:   Vital Signs: Therapy Vitals Temp: 98.1 F (36.7 C) Pulse Rate: 75 Resp: 18 BP: 124/86 Patient Position (if appropriate): Sitting Oxygen Therapy SpO2: 99 % O2 Device: Room Air Pain: Pain Assessment Pain Scale: 0-10 Pain Score: 5  Pain Type: Acute pain Pain Location: Hip Pain Orientation: Left Pain Descriptors / Indicators: Aching Pain Frequency: Intermittent Pain Intervention(s): Medication (See eMAR) Mobility:   Locomotion :    Trunk/Postural Assessment :    Balance:   Exercises:   Other Treatments:      Therapy/Group: Individual Therapy  Junie Panning 02/24/2021, 3:51 PM

## 2021-02-24 NOTE — Progress Notes (Signed)
Occupational Therapy Session Note  Patient Details  Name: [name] MRN: [id number] Date of Birth: 1966/03/24  Today's Date: 02/24/2021 OT Group Time: 1100-1145 OT Group Time Calculation (min): 45 min    Short Term Goals: Week 1:  OT Short Term Goal 1 (Week 1): Pt will perform BSC/toilet transfers w/ AD with Min A OT Short Term Goal 1 - Progress (Week 1): Met OT Short Term Goal 2 (Week 1): Pt will perform LB dressing w/ AE no more than Min A OT Short Term Goal 2 - Progress (Week 1): Progressing toward goal OT Short Term Goal 3 (Week 1): Pt will perform UB/LB bathing with no more than Min A OT Short Term Goal 3 - Progress (Week 1): Met  Skilled Therapeutic Interventions/Progress Updates:    Pt participated in rhythmic drumming group. Pain not reported, but fatigeu by end of session on LUE Focus of group on BUE coordination, strengthening, endurance, timing/control, activity tolerance, and social participation and engagement. Pt performs session from seated position for energy conservation. Skilled interventions included grading shoulder ROM based on fatigue level. Warm up performed prior to exercises and UB stretching completed at end of group with demo from OT. Pt able to select preferred song to share with group "anything bruno mars". Returned pt to room at end of session. Exited session with pt seated in w/c, exit alarm on and call light in reach  Therapy Documentation Precautions:  Precautions Precautions: Fall Precaution Comments: sling for comfort Restrictions Weight Bearing Restrictions: No LUE Weight Bearing: Weight bearing as tolerated RLE Weight Bearing: Touchdown weight bearing LLE Weight Bearing: Non weight bearing Other Position/Activity Restrictions: RLE TDWB, LUE WBAT General:   Vital Signs: Therapy Vitals Temp: 98.6 F (37 C) Temp Source: Oral Pulse Rate: 71 Resp: 14 BP: 111/78 Patient Position (if appropriate): Lying Oxygen Therapy SpO2: 100 % O2  Device: Room Air Pain:   ADL: ADL Eating: Not assessed Grooming: Not assessed Upper Body Bathing: Setup Lower Body Bathing: Moderate assistance Where Assessed-Lower Body Bathing: Sitting at sink Upper Body Dressing: Minimal assistance Lower Body Dressing: Maximal assistance Toileting: Maximal assistance Toilet Transfer: Moderate assistance Toilet Transfer Method: Stand pivot Vision   Perception    Praxis   Exercises:   Other Treatments:     Therapy/Group: Group Therapy  Tonny Branch 02/24/2021, 6:49 AM

## 2021-02-24 NOTE — Progress Notes (Signed)
Occupational Therapy Session Note  Patient Details  Name: [name] MRN: [id number] Date of Birth: [birth year]  Today's Date: 02/24/2021 OT Individual Time: 8088-1103 OT Individual Time Calculation (min): 31 min    Skilled Therapeutic Interventions/Progress Updates:    Pt greeted in the bathroom, supervision for ambulatory transfer out of the bathroom using RW, pt wearing the Rt knee brace and mindful of his TDWB precautions given min cues. He stood to complete hand washing before returning to the w/c, reported he was able to meet 3/3 components of toileting post BM void on his own this AM. While pt ate his breakfast, OT printed off an energy conservation/work simplification packet to utilize during ADL/IADL activity at home. Discussed functional activity modifications to make to maximize his independence while being safe and adhering to WB precautions. Pt receptive to education, collaborative during discussion. At end of session pt remained sitting up in the w/c, all needs within reach and chair alarm set.  Therapy Documentation Precautions:  Precautions Precautions: Fall Precaution Comments: sling for comfort Restrictions Weight Bearing Restrictions: No LUE Weight Bearing: Weight bearing as tolerated RLE Weight Bearing: Touchdown weight bearing LLE Weight Bearing: Non weight bearing Other Position/Activity Restrictions: RLE TDWB, LUE WBAT Pain: pt reported being premedicated for Rt LE pain Pain Assessment Pain Scale: 0-10 Pain Score: 5  Pain Type: Acute pain Pain Location: Hip Pain Orientation: Left Pain Descriptors / Indicators: Aching Pain Frequency: Intermittent Pain Onset: With Activity Pain Intervention(s): Medication (See eMAR) ADL: ADL Eating: Not assessed Grooming: Not assessed Upper Body Bathing: Setup Lower Body Bathing: Moderate assistance Where Assessed-Lower Body Bathing: Sitting at sink Upper Body Dressing: Minimal assistance Lower Body Dressing:  Maximal assistance Toileting: Maximal assistance Toilet Transfer: Moderate assistance Toilet Transfer Method: Stand pivot      Therapy/Group: Individual Therapy  [name] [name] 02/24/2021, 12:17 PM
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
## 2021-02-25 NOTE — Progress Notes (Signed)
Physical Therapy Session Note  Patient Details  Name: [name] MRN: [id number] Date of Birth: 08/16/1966  Today's Date: 02/25/2021 PT Individual Time: 0805-0900 PT Individual Time Calculation (min): 55 min   Short Term Goals: Week 2:  PT Short Term Goal 1 (Week 2): pt to demonstrate bed mobility CGA PT Short Term Goal 2 (Week 2): pt to demonstrate functional transfers with LRAD CGA PT Short Term Goal 3 (Week 2): pt to demonstrate gait 10' CGA with LRAD  Skilled Therapeutic Interventions/Progress Updates:   Pt received sitting in WC and agreeable to PT. WC mobility 2 x 173f with supervision assist from PT with cues for safety with doorway management in.out of room and through orthogym door.   UBE 4 min forward/5 min reverse on random resistance cues for consistent RPM throughout resistance range.   Gait training with RW 2 x 268fwith supervision assist from PT with min cues for TDWB on the RLE.   Standing [facility]olerance [facility] number/letter 1-10/A-J. And visual pursuits A-Z. Supervision assist for standing balance throughout with 1-0 UE support and prolonged rest break between bouts.   [facility] to [facility] and [facility] and all needs met.         Therapy Documentation Precautions:  Precautions Precautions: Fall Precaution Comments: sling for comfort Restrictions Weight Bearing Restrictions: Yes LUE Weight Bearing: Weight bearing as tolerated RLE Weight Bearing: Touchdown weight bearing LLE Weight Bearing: Non weight bearing Other Position/Activity Restrictions: RLE TDWB, LUE WBAT Pain: Pain Assessment Pain Scale: 0-10 Pain Score: 0-No pain    Therapy/Group: Individual Therapy  AuLorie Phenix/12/2020, 10:01 AM
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
## 2021-02-26 NOTE — Progress Notes (Signed)
Occupational Therapy Session Note  Patient Details  Name: [name] MRN: [id number] Date of Birth: Oct 18, 1965  Today's Date: 02/26/2021 OT Group Time: 1100-1200 OT Group Time Calculation (min): 60 min   Skilled Therapeutic Interventions/Progress Updates:    Pt engaged in therapeutic w/c level dance group focusing on patient choice, UE/LE strengthening, salience, activity tolerance, and social participation. Pt was guided through various dance-based exercises involving UEs/LEs and trunk. All music was selected by group members. Emphasis placed on general strengthening and endurance while adhering to WB precautions. Pt with high levels of participation while seated, often sang along to familiar music and focused on Lt UE ROM/strengthening. Pt kept his Rt LE elevated for TDWB and engaged in ankle ROM per his tolerance. At end of session pt was escorted back to the room by PT.    Therapy Documentation Precautions:  Precautions Precautions: Fall Precaution Comments: sling for comfort Restrictions Weight Bearing Restrictions: Yes LUE Weight Bearing: Weight bearing as tolerated RLE Weight Bearing: Touchdown weight bearing LLE Weight Bearing: Non weight bearing Other Position/Activity Restrictions: RLE TDWB, LUE WBAT Pain: no s/s pain during tx Pain Assessment Pain Scale: 0-10 Pain Score: 4  Pain Type: Acute pain Pain Location: Hip Pain Orientation: Right Pain Descriptors / Indicators: Aching Pain Frequency: Intermittent Pain Onset: With Activity Patients Stated Pain Goal: 0 Pain Intervention(s): Medication (See eMAR) ADL: ADL Eating: Not assessed Grooming: Not assessed Upper Body Bathing: Setup Lower Body Bathing: Moderate assistance Where Assessed-Lower Body Bathing: Sitting at sink Upper Body Dressing: Minimal assistance Lower Body Dressing: Maximal assistance Toileting: Maximal assistance Toilet Transfer: Moderate assistance Toilet Transfer Method: Stand pivot      Therapy/Group: Group Therapy  Levorn Oleski A Lunetta Marina 02/26/2021, 12:42 PM

## 2021-02-27 LAB — CBC WITH DIFFERENTIAL/PLATELET
Abs Immature Granulocytes: 0.15 10*3/uL — ABNORMAL HIGH (ref 0.00–0.07)
Basophils Absolute: 0.1 10*3/uL (ref 0.0–0.1)
Basophils Relative: 2 %
Eosinophils Absolute: 0.1 10*3/uL (ref 0.0–0.5)
Eosinophils Relative: 3 %
HCT: 33.6 % — ABNORMAL LOW (ref 39.0–52.0)
Hemoglobin: 10.8 g/dL — ABNORMAL LOW (ref 13.0–17.0)
Immature Granulocytes: 3 %
Lymphocytes Relative: 34 %
Lymphs Abs: 1.6 10*3/uL (ref 0.7–4.0)
MCH: 29.7 pg (ref 26.0–34.0)
MCHC: 32.1 g/dL (ref 30.0–36.0)
MCV: 92.3 fL (ref 80.0–100.0)
Monocytes Absolute: 0.6 10*3/uL (ref 0.1–1.0)
Monocytes Relative: 12 %
Neutro Abs: 2.3 10*3/uL (ref 1.7–7.7)
Neutrophils Relative %: 46 %
Platelets: 507 10*3/uL — ABNORMAL HIGH (ref 150–400)
RBC: 3.64 MIL/uL — ABNORMAL LOW (ref 4.22–5.81)
RDW: 13.4 % (ref 11.5–15.5)
WBC: 4.9 10*3/uL (ref 4.0–10.5)
nRBC: 0 % (ref 0.0–0.2)

## 2021-02-27 LAB — BASIC METABOLIC PANEL
Anion gap: 6 (ref 5–15)
BUN: 11 mg/dL (ref 6–20)
CO2: 30 mmol/L (ref 22–32)
Calcium: 9.6 mg/dL (ref 8.9–10.3)
Chloride: 100 mmol/L (ref 98–111)
Creatinine, Ser: 1.04 mg/dL (ref 0.61–1.24)
GFR, Estimated: >60 mL/min (ref >60)
Glucose, Bld: 137 mg/dL — ABNORMAL HIGH (ref 70–99)
Potassium: 4.2 mmol/L (ref 3.5–5.1)
Sodium: 136 mmol/L (ref 135–145)

## 2021-02-27 NOTE — Progress Notes (Signed)
Patient ID: [name], male   DOB: [birth year], 55 y.o.   MRN: [id number]   Patient MATCH entered into system [id number] Effective 03/01/2021 Term 03/08/2021  Lavera Guise, [place] [phone]

## 2021-02-27 NOTE — Progress Notes (Signed)
Physical Therapy Session Note  Patient Details  Name: [name] MRN: [id number] Date of Birth: 1965-12-19  Today's Date: 02/27/2021 PT Individual Time: 1100-1200 PT Individual Time Calculation (min): 60 min   Short Term Goals: Week 1:  PT Short Term Goal 1 (Week 1): pt to demonstrate supine<>sit min A PT Short Term Goal 1 - Progress (Week 1): Met PT Short Term Goal 2 (Week 1): pt to demonstrate functional transfers within [facility] precautions with LRAD at min A PT Short Term Goal 2 - Progress (Week 1): Met PT Short Term Goal 3 (Week 1): pt to demonstrate ability to hop/gait train within WB status for 10' min A PT Short Term Goal 3 - Progress (Week 1): Not met PT Short Term Goal 4 (Week 1): pt to demonstrate tolerance to sitting OOB 3 hours PT Short Term Goal 4 - Progress (Week 1): Met Week 2:  PT Short Term Goal 1 (Week 2): pt to demonstrate bed mobility CGA PT Short Term Goal 2 (Week 2): pt to demonstrate functional transfers with LRAD CGA PT Short Term Goal 3 (Week 2): pt to demonstrate gait 10' CGA with LRAD  Skilled Therapeutic Interventions/Progress Updates:    pt received in [facility] and agreeable to therapy. Pt directed in [facility] mobility 250' supervision, taken outside with nursing aware and agreeable. Pt directed in x3 Sit to stand to Rolling walker supervision with only one VC for hand placement, pt able to stand within WB status for 2-3 mins each time at supervision. Pt reported feeling much with being outside for session as he reported feeling "sorry for myself" this previous weekend. Pt reported he was much better now and more motivated for all mobility. PT and pt discussed DC plans, currently to go home with wife, she able to assist with all needs, they have ordered all equipment recommended and has a WC and cushion at home already. Pt will need transport to take home and get pt into home as he is unable to complete stairs. Social work is aware of this. Pt directed in WC mobility on uneven  surfaces and with multiple directional changes over 250'. Pt then directed in [place] mobility into hospital and on/off of elevator supervision then additional 200'. PT completed rest of distance to room, left in [facility], All needs in reach and in good condition. Call light in hand.    Therapy Documentation Precautions:  Precautions Precautions: Fall Precaution Comments: sling for comfort Restrictions Weight Bearing Restrictions: Yes LUE Weight Bearing: Weight bearing as tolerated RLE Weight Bearing: Touchdown weight bearing LLE Weight Bearing: Non weight bearing Other Position/Activity Restrictions: RLE TDWB, LUE WBAT General:   Vital Signs:   Pain: Pain Assessment Pain Scale: 0-10 Pain Score: 5  Pain Type: Acute pain Pain Location: Hip Pain Orientation: Right Pain Descriptors / Indicators: Aching Pain Frequency: Intermittent Pain Onset: With Activity Patients Stated Pain Goal: 0 Pain Intervention(s): Medication (See eMAR) Mobility:   Locomotion :    Trunk/Postural Assessment :    Balance:   Exercises:   Other Treatments:      Therapy/Group: Individual Therapy  Junie Panning 02/27/2021, 12:21 PM

## 2021-02-27 NOTE — Progress Notes (Signed)
Patient educated on self administration of Lovenox injection and he was able to demonstrate it. We continue to monitor.
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
## 2021-02-27 NOTE — Progress Notes (Signed)
Occupational Therapy Session Note  Patient Details  Name: [name] MRN: [id number] Date of Birth: 1966-05-25  Today's Date: 02/27/2021 OT Individual Time: 1300-1413 OT Individual Time Calculation (min): 73 min   Skilled Therapeutic Interventions/Progress Updates:    Pt greeted in the w/c with pain reportedly manageable for tx. Started session with d/c planning, pt has already ordered the needed DME for the house and has arranged f/u with SW. He discussed his plans for household mobility in the w/c and also plans for ADL engagement which were very safe and exhibited increased anticipatory awareness. We were unsure if he needed to cover his Rt LE brace in the shower, checked with PA who stated to cover at this time. Therefore OT taught pt how to cover his Rt LE in the shower, pt able to exhibit carryover of understanding given hands on practice. He did not want to shower today, but agreeable to shower at some point with therapy before his d/c date. To work on [employer] and endurance, pt self propelled his w/c to the outdoor patio area. Worked on w/c navigation over uneven terrain and up/down small inclines. Pt also ambulated a short distance using RW with supervision assist, mindful of his TDWB restrictions. He then self propelled the w/c back to his room. Ambulatory toilet trasnfer completed using RW with distant supervision. After handwashing, OT attempted to print pt an UE HEP but unsuccessful due to printer issues. Pt understanding, provided him with a tband until primary OT can follow up with an exercise program. He remained sitting up in the w/c at close of session, all needs within reach.   Therapy Documentation Precautions:  Precautions Precautions: Fall Precaution Comments: sling for comfort Restrictions Weight Bearing Restrictions: Yes LUE Weight Bearing: Weight bearing as tolerated RLE Weight Bearing: Touchdown weight bearing LLE Weight Bearing: Non weight bearing Other  Position/Activity Restrictions: RLE TDWB, LUE WBAT Vital Signs: Therapy Vitals Temp: 98.4 F (36.9 C) Pulse Rate: 84 Resp: 18 BP: 123/90 Patient Position (if appropriate): Sitting Oxygen Therapy SpO2: 100 % O2 Device: Room Air Pain: Pain Assessment Pain Scale: 0-10 Pain Score: 5  Pain Type: Acute pain Pain Location: Hip Pain Orientation: Right Pain Descriptors / Indicators: Aching Pain Frequency: Intermittent Pain Onset: With Activity Patients Stated Pain Goal: 0 Pain Intervention(s): Medication (See eMAR) ADL: ADL Eating: Not assessed Grooming: Not assessed Upper Body Bathing: Setup Lower Body Bathing: Moderate assistance Where Assessed-Lower Body Bathing: Sitting at sink Upper Body Dressing: Minimal assistance Lower Body Dressing: Maximal assistance Toileting: Maximal assistance Toilet Transfer: Moderate assistance Toilet Transfer Method: Stand pivot     Therapy/Group: Individual Therapy  Yoskar Murrillo A Lakeithia Rasor 02/27/2021, 3:48 PM

## 2021-02-27 NOTE — Progress Notes (Signed)
Occupational Therapy Session Note  Patient Details  Name: Daryll Spisak MRN: [id number] Date of Birth: Nov 26, 1965  Today's Date: 02/27/2021 OT Individual Time: 2876-8115 OT Individual Time Calculation (min): 55 min    Short Term Goals: Week 1:  OT Short Term Goal 1 (Week 1): Pt will perform BSC/toilet transfers w/ AD with Min A OT Short Term Goal 1 - Progress (Week 1): Met OT Short Term Goal 2 (Week 1): Pt will perform LB dressing w/ AE no more than Min A OT Short Term Goal 2 - Progress (Week 1): Progressing toward goal OT Short Term Goal 3 (Week 1): Pt will perform UB/LB bathing with no more than Min A OT Short Term Goal 3 - Progress (Week 1): Met Week 2:  OT Short Term Goal 1 (Week 2): STGs = LTGs at Supervision   Skilled Therapeutic Interventions/Progress Updates:    Pt greeted at time of session sitting up in wheelchair agreeable to OT session, no pain resting. Pt aware of DC this week, also that will have OP surgery for R knee. Self propel > gym with brief rest break, Supervision. In ADL apartment reviewed IADL techniques for safely accessing fridge, cabinets, etc from both wheelchair and RW level, pt stating he already had a plan for this and feels able to do so. However did retrieve 6/6 items from fridge with Mod I from wheelchair level. Set up on SCIFIT and performed on level 3 for 6 mins total switching directions half way through. Pt ambulated in hall approx 50 feet with RW and excellent adherence to WB precautions for RLE. Discussed DC planning throughout session. Set up in room with call bell in reach all needs met.   Therapy Documentation Precautions:  Precautions Precautions: Fall Precaution Comments: sling for comfort Restrictions Weight Bearing Restrictions: Yes LUE Weight Bearing: Weight bearing as tolerated RLE Weight Bearing: Touchdown weight bearing LLE Weight Bearing: Non weight bearing Other Position/Activity Restrictions: RLE TDWB, LUE  WBAT    Therapy/Group: Individual Therapy  Viona Gilmore 02/27/2021, 7:13 AM

## 2021-02-28 MED ORDER — APIXABAN 5 MG PO TABS: 5.0000 mg | ORAL_TABLET | Freq: Two times a day (BID) | ORAL | Status: DC

## 2021-02-28 MED ORDER — APIXABAN 2.5 MG PO TABS
2.5000 mg | ORAL_TABLET | Freq: Two times a day (BID) | ORAL | Status: DC
  Administered 2021-02-28 – 2021-03-02 (×5): 2.5 mg via ORAL
  Filled 2021-02-28 (×5): qty 1

## 2021-02-28 NOTE — Discharge Instructions (Addendum)
Inpatient Rehab Discharge Instructions  [name] Discharge date and time: No discharge date for patient encounter.   Activities/Precautions/ Functional Status: Activity:  Touchdown right lower extremity Diet: regular diet Wound Care: Routine skin checks Functional status:  ___ No restrictions     ___ Walk up steps independently ___ 24/7 supervision/assistance   ___ Walk up steps with assistance ___ Intermittent supervision/assistance  ___ Bathe/dress independently ___ Walk with walker     _x__ Bathe/dress with assistance ___ Walk Independently    ___ Shower independently ___ Walk with assistance    ___ Shower with assistance ___ No alcohol     ___ Return to work/school ________  COMMUNITY REFERRALS UPON DISCHARGE:    Home Health:   PT     OT                      Agency: TBD Phone:   Medical Equipment/Items Ordered: Agricultural consultant                                                  Agency/Supplier: [employer]   Special Instructions: Hospital follow up scheduled at [facility] on April 04, 2021 at 2:30 PM with Loreen Freud, NP   No driving smoking or alcohol  Follow-up wake for [facility] ophthalmology for delayed repair of lacrimal injury with oculoplastics per recommendations of Dr. Randon Goldsmith  My questions have been answered and I understand these instructions. I will adhere to these goals and the provided educational materials after my discharge from the hospital.  Patient/Caregiver Signature _______________________________ Date __________  Clinician Signature _______________________________________ Date __________  Please bring this form and your medication list with you to all your follow-up doctor's appointments. Inpatient Rehab Discharge Instructions  [name] Discharge date and time: No discharge date for patient encounter.   Activities/Precautions/ Functional Status: Activity: Touchdown weightbearing right lower  extremity Diet: Regular Wound Care: Routine skin checks Functional status:  ___ No restrictions     ___ Walk up steps independently ___ 24/7 supervision/assistance   ___ Walk up steps with assistance ___ Intermittent supervision/assistance  ___ Bathe/dress independently ___ Walk with walker     _x__ Bathe/dress with assistance ___ Walk Independently    ___ Shower independently ___ Walk with assistance    ___ Shower with assistance ___ No alcohol     ___ Return to work/school ________  Special Instructions: No driving smoking or alcohol   My questions have been answered and I understand these instructions. I will adhere to these goals and the provided educational materials after my discharge from the hospital.  Patient/Caregiver Signature _______________________________ Date __________  Clinician Signature _______________________________________ Date __________  Please bring this form and your medication list with you to all your follow-up doctor's appointments.     Information on my medicine - ELIQUIS (apixaban)  Why was Eliquis prescribed for you? Eliquis was prescribed for you to reduce the risk of blood clots forming after orthopedic surgery.    What do You need to know about Eliquis? Take your Eliquis TWICE DAILY - one tablet in the morning and one tablet in the evening with or without food.  It would be best to take the dose about the same time each day.  If you have difficulty swallowing the tablet whole please discuss with your pharmacist how to take the  medication safely.  Take Eliquis exactly as prescribed by your doctor and DO NOT stop taking Eliquis without talking to the doctor who prescribed the medication.  Stopping without other medication to take the place of Eliquis may increase your risk of developing a clot.  After discharge, you should have regular check-up appointments with your healthcare provider that is prescribing your Eliquis.  What do you do  if you miss a dose? If a dose of ELIQUIS is not taken at the scheduled time, take it as soon as possible on the same day and twice-daily administration should be resumed.  The dose should not be doubled to make up for a missed dose.  Do not take more than one tablet of ELIQUIS at the same time.  Important Safety Information A possible side effect of Eliquis is bleeding. You should call your healthcare provider right away if you experience any of the following: Bleeding from an injury or your nose that does not stop. Unusual colored urine (red or dark brown) or unusual colored stools (red or black). Unusual bruising for unknown reasons. A serious fall or if you hit your head (even if there is no bleeding).  Some medicines may interact with Eliquis and might increase your risk of bleeding or clotting while on Eliquis. To help avoid this, consult your healthcare provider or pharmacist prior to using any new prescription or non-prescription medications, including herbals, vitamins, non-steroidal anti-inflammatory drugs (NSAIDs) and supplements.  This website has more information on Eliquis (apixaban): http://www.eliquis.com/eliquis/home

## 2021-02-28 NOTE — Progress Notes (Signed)
Patient ID: [name], male   DOB: Nov 25, 1965, 55 y.o.   MRN: [id number] Team Conference Report to Patient/Family  Team Conference discussion was reviewed with the patient and caregiver, including goals, any changes in plan of care and target discharge date.  Patient and caregiver express understanding and are in agreement.  The patient has a target discharge date of 03/02/21.  SW met with pt and spouse in room, provided updates. Pt will have re check labs on Thursday. Both concerned about navigating steps after d/c. SW will arrange PTAR transport, also provided transportation resources for appointments after d/c.   Dyanne Iha 02/28/2021, 1:28 PM

## 2021-02-28 NOTE — Progress Notes (Signed)
Occupational Therapy Session Note  Patient Details  Name: [name] MRN: [id number] Date of Birth: 03/19/1966  Today's Date: 02/28/2021 OT Individual Time: 0600-4599 OT Individual Time Calculation (min): 58 min    Short Term Goals: Week 1:  OT Short Term Goal 1 (Week 1): Pt will perform BSC/toilet transfers w/ AD with Min A OT Short Term Goal 1 - Progress (Week 1): Met OT Short Term Goal 2 (Week 1): Pt will perform LB dressing w/ AE no more than Min A OT Short Term Goal 2 - Progress (Week 1): Progressing toward goal OT Short Term Goal 3 (Week 1): Pt will perform UB/LB bathing with no more than Min A OT Short Term Goal 3 - Progress (Week 1): Met Week 2:  OT Short Term Goal 1 (Week 2): STGs = LTGs at Supervision  Skilled Therapeutic Interventions/Progress Updates:    Pt received in room in w/c and consented to OT tx. Session focused on BUE strengthening HEP to increase strength and activity tolerance for ADLs and functional transfers. Pt wheeled self down to therapy gym, able to navigate obstacles with mod I. Pt instructed in unilateral db exercises with 3#db in LUE, 10#db in RUE including shoulder press, shoulder flexion (RUE 10#, 7#, then 5#), elbow flexion, 5#db chest press for 3x15 with min cuing for proper technique with good carryover. Pt required increased time for exercises with rest breaks due to fatigue. Pt then instructed in standing activity at elevated table, maintained WB precautions throughout activity and demo'd good balance. Instructed in functional reaching task with clothespins while standing with close SUP. After tx, pt wheeled self back to room with mod I to avoid obstacles again, left with all needs met.       Therapy Documentation Precautions:  Precautions Precautions: Fall Precaution Comments: sling for comfort Restrictions Weight Bearing Restrictions: Yes LUE Weight Bearing: Weight bearing as tolerated RLE Weight Bearing: Touchdown weight bearing LLE  Weight Bearing: Non weight bearing Other Position/Activity Restrictions: RLE TDWB, LUE WBAT    Pain: Pain Assessment Pain Scale: 0-10 Pain Score: 0-No pain   Therapy/Group: Individual Therapy  Doug Bucklin 02/28/2021, 9:51 AM
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
## 2021-02-28 NOTE — Progress Notes (Signed)
Occupational Therapy Session Note  Patient Details  Name: [name] MRN: [id number] Date of Birth: May 08, 1966  Today's Date: 02/28/2021 OT Individual Time: 1545-1610 OT Individual Time Calculation (min): 25 min    Short Term Goals: Week 1:  OT Short Term Goal 1 (Week 1): Pt will perform BSC/toilet transfers w/ AD with Min A OT Short Term Goal 1 - Progress (Week 1): Met OT Short Term Goal 2 (Week 1): Pt will perform LB dressing w/ AE no more than Min A OT Short Term Goal 2 - Progress (Week 1): Progressing toward goal OT Short Term Goal 3 (Week 1): Pt will perform UB/LB bathing with no more than Min A OT Short Term Goal 3 - Progress (Week 1): Met Week 2:  OT Short Term Goal 1 (Week 2): STGs = LTGs at Supervision  Skilled Therapeutic Interventions/Progress Updates:    Pt received in w/c and consented to OT tx. Pt seen for w/c mobility and mgmt over uneven surfaces, around obstacles outside, and for BUE strengthening this session. Pt wheeled self down halls, into elevator completed turns in tight spaces with SUP to watch out to not hit hands on surfaces. Pt Demo'd good activity tolerance while wheeling self outside. Once outside at reflection pond, pt instructed in blue theraband exercises including tricep extensions for 3x15. Pt required min rest breaks in between sets due to fatigued BUEs. Pt wheeled back to hospital entrance, req therapist assist to wheel back to room 2/2 fatigue. After session, pt left up in w/c in room with spouse present and all needs met.   Therapy Documentation Precautions:  Precautions Precautions: Fall Precaution Comments: sling for comfort Restrictions Weight Bearing Restrictions: Yes LUE Weight Bearing: Weight bearing as tolerated RLE Weight Bearing: Touchdown weight bearing LLE Weight Bearing: Non weight bearing Other Position/Activity Restrictions: RLE TDWB, LUE WBAT   Vital Signs: Therapy Vitals Temp: 98.5 F (36.9 C) Temp Source: Oral Pulse  Rate: 81 Resp: 18 BP: 122/74 Patient Position (if appropriate): Sitting Oxygen Therapy SpO2: 100 % O2 Device: Room Air Pain: none    Therapy/Group: Individual Therapy  Corinthia Helmers 02/28/2021, 4:08 PM

## 2021-02-28 NOTE — Progress Notes (Signed)
Physical Therapy Session Note  Patient Details  Name: [name] MRN: [id number] Date of Birth: 10-23-1965  Today's Date: 02/28/2021 PT Individual Time: 0800-0900 PT Individual Time Calculation (min): 60 min   Short Term Goals: Week 1:  PT Short Term Goal 1 (Week 1): pt to demonstrate supine<>sit min A PT Short Term Goal 1 - Progress (Week 1): Met PT Short Term Goal 2 (Week 1): pt to demonstrate functional transfers within [facility] precautions with LRAD at min A PT Short Term Goal 2 - Progress (Week 1): Met PT Short Term Goal 3 (Week 1): pt to demonstrate ability to hop/gait train within WB status for 10' min A PT Short Term Goal 3 - Progress (Week 1): Not met PT Short Term Goal 4 (Week 1): pt to demonstrate tolerance to sitting OOB 3 hours PT Short Term Goal 4 - Progress (Week 1): Met Week 2:  PT Short Term Goal 1 (Week 2): pt to demonstrate bed mobility CGA PT Short Term Goal 2 (Week 2): pt to demonstrate functional transfers with LRAD CGA PT Short Term Goal 3 (Week 2): pt to demonstrate gait 10' CGA with LRAD  Skilled Therapeutic Interventions/Progress Updates:    pt received in bed and agreeable to therapy. Pt directed in [facility] mobility to day room 200' supervision with good technique and navigation. Pt directed in step transfer to mat table from [facility] with Rolling walker supervision, sit<>Supine supervision. Pt directed in gait training with Rolling walker 75' supervision within WB status, initially hopping technique and then progressed to light touch down for stability with steps and heavy reliance on UE with stance phase on RLE. Pt directed in seated BLE strengthening exercises 4# on LLE and brace and body weight resistance on RLE for 2x10 marching, LAQ, hip abduction/adduction and BUE strengthening with 3# on LUE and 7# on RUE for bicep curls, overhead press 2x10. Pt directed in return to room in [facility] supervision. Pt left in WC, All needs in reach and in good condition. Call light in hand.  And  ice packs applied to Rt lateral/posterior hip and Rt lateral knee as pt reported pain post activity, did not rank. Reported ice felt better.   Therapy Documentation Precautions:  Precautions Precautions: Fall Precaution Comments: sling for comfort Restrictions Weight Bearing Restrictions: Yes LUE Weight Bearing: Weight bearing as tolerated RLE Weight Bearing: Touchdown weight bearing LLE Weight Bearing: Non weight bearing Other Position/Activity Restrictions: RLE TDWB, LUE WBAT General:   Vital Signs:   Pain: Pain Assessment Pain Scale: 0-10 Pain Score: 4  Pain Type: Acute pain Pain Location: Hip Pain Orientation: Right Pain Descriptors / Indicators: Aching Pain Frequency: Intermittent Pain Onset: With Activity Patients Stated Pain Goal: 0 Pain Intervention(s): Medication (See eMAR) Mobility: Bed Mobility Bed Mobility: Rolling Right;Rolling Left;Sit to Supine;Scooting to [facility];Supine to Sit;Sitting - Scoot to [employer] of Bed Rolling Right: Independent with assistive device Rolling Left: Independent with assistive device Supine to Sit: Supervision/Verbal cueing Sitting - Scoot to Edge of Bed: Supervision/Verbal cueing Sit to Supine: Supervision/Verbal cueing Scooting to HOB: Supervision/Verbal Cueing Transfers Transfers: Sit to Stand;Stand Pivot Transfers;Stand to Sit Sit to Stand: Supervision/Verbal cueing Stand to Sit: Supervision/Verbal cueing Stand Pivot Transfers: Supervision/Verbal cueing Stand Pivot Transfer Details: Verbal cues for safe use of DME/AE;Verbal cues for technique Transfer (Assistive device): Rolling walker Locomotion : Gait Ambulation: Yes Gait Assistance: Supervision/Verbal cueing Gait Distance (Feet): 75 Feet Assistive device: Rolling walker Gait Assistance Details: Verbal cues for technique Gait Assistance Details: good awareness and technique with  weight bearing status Gait Gait: Yes Gait Pattern: Impaired Gait Pattern: Step-to pattern;Trunk  flexed (hopping due to weight bearing status) Gait velocity: reduced Stairs / Additional Locomotion Stairs: No (pt unable to safely complete within WB status) Ramp: Supervision/Verbal cueing (in WC) Curb: Dependent - Patient 0% (unable to complete safely within WB status) Wheelchair Mobility Wheelchair Mobility: Yes Wheelchair Assistance: Independent with Camera operator: Both upper extremities Wheelchair Parts Management: Independent Distance: 350'+  Trunk/Postural Assessment : Cervical Assessment Cervical Assessment: Within Functional Limits Thoracic Assessment Thoracic Assessment: Within Functional Limits Lumbar Assessment Lumbar Assessment: Exceptions to [facility] (posterior pelvic tilt) Postural Control Postural Control: Deficits on evaluation  Balance: Balance Balance Assessed: Yes Dynamic Sitting Balance Dynamic Sitting - Balance Support: Feet unsupported;Right upper extremity supported Dynamic Sitting - Level of Assistance: 7: Independent Dynamic Sitting - Balance Activities: Reaching for objects;Reaching across midline;Lateral lean/weight shifting Static Standing Balance Static Standing - Level of Assistance: 5: Stand by assistance Dynamic Standing Balance Dynamic Standing - Balance Support: Right upper extremity supported;Left upper extremity supported Dynamic Standing - Level of Assistance: 5: Stand by assistance Dynamic Standing - Balance Activities: Lateral lean/weight shifting;Forward lean/weight shifting;Reaching for objects Exercises:   Other Treatments:      Therapy/Group: Individual Therapy  Junie Panning 02/28/2021, 12:04 PM

## 2021-02-28 NOTE — Progress Notes (Signed)
Occupational Therapy Session Note  Patient Details  Name: [name] MRN: [id number] Date of Birth: Apr 18, 1966  Today's Date: 02/28/2021 OT Individual Time: 4784-1282 OT Individual Time Calculation (min): 43 min    Short Term Goals: Week 1:  OT Short Term Goal 1 (Week 1): Pt will perform BSC/toilet transfers w/ AD with Min A OT Short Term Goal 1 - Progress (Week 1): Met OT Short Term Goal 2 (Week 1): Pt will perform LB dressing w/ AE no more than Min A OT Short Term Goal 2 - Progress (Week 1): Progressing toward goal OT Short Term Goal 3 (Week 1): Pt will perform UB/LB bathing with no more than Min A OT Short Term Goal 3 - Progress (Week 1): Met Week 2:  OT Short Term Goal 1 (Week 2): STGs = LTGs at Supervision   Skilled Therapeutic Interventions/Progress Updates:    Pt greeted at time of session sitting up in wheelchair agreeable to OT session. No pain resting. Pt and wife having questions regarding prognosis, when he can weight bear more through RLE, etc. And questions answered. Communicated that MD would make that call from imaging to determine increase in WB status. Wife having questions about stairs, again reiterated that stairs in back of house with no rail is not safe, recommended speak with PT regarding concerns, they may put in bilateral hand rails at back entrance. Pt self propel room <> gym Mod I with BUE propulsion, focus of remaining session on endurance and BUE strength with 4kg red weighted ball for rebounder tosses 1x30, toss +overhead 1x30, toss + twist 1x20. Needing to urinate at this time, ambulated in room > bathroom CGA and close supervision with RW, standing to urinate distance supervision. Transisioned BSC over toilet for pt to have BM, left on [facility] with wife present, to call for NT when completed.   Therapy Documentation Precautions:  Precautions Precautions: Fall Precaution Comments: sling for comfort Restrictions Weight Bearing Restrictions: Yes LUE Weight  Bearing: Weight bearing as tolerated RLE Weight Bearing: Touchdown weight bearing LLE Weight Bearing: Non weight bearing Other Position/Activity Restrictions: RLE TDWB, LUE WBAT    Therapy/Group: Individual Therapy  Viona Gilmore 02/28/2021, 7:22 AM
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
## 2021-03-01 ENCOUNTER — Other Ambulatory Visit (HOSPITAL_COMMUNITY): Payer: Self-pay

## 2021-03-01 MED ORDER — APIXABAN 2.5 MG PO TABS
2.5000 mg | ORAL_TABLET | Freq: Two times a day (BID) | ORAL | 0 refills | Status: DC
  Filled 2021-03-01: qty 60, 30d supply, fill #0

## 2021-03-01 MED ORDER — DOCUSATE SODIUM 100 MG PO CAPS: 100.0000 mg | ORAL_CAPSULE | Freq: Two times a day (BID) | ORAL | 0 refills | Status: DC

## 2021-03-01 MED ORDER — POLYVINYL ALCOHOL 1.4 % OP SOLN: 1.0000 [drp] | OPHTHALMIC | 0 refills | Status: DC | PRN

## 2021-03-01 MED ORDER — POLYETHYLENE GLYCOL 3350 17 G PO PACK: 17.0000 g | PACK | Freq: Two times a day (BID) | ORAL | 0 refills | Status: DC

## 2021-03-01 MED ORDER — LEVOTHYROXINE SODIUM 125 MCG PO TABS
125.0000 ug | ORAL_TABLET | Freq: Every morning | ORAL | 0 refills | Status: DC
  Filled 2021-03-01: qty 30, 30d supply, fill #0

## 2021-03-01 MED ORDER — PREGABALIN 50 MG PO CAPS
50.0000 mg | ORAL_CAPSULE | Freq: Two times a day (BID) | ORAL | 0 refills | Status: DC
  Filled 2021-03-01: qty 60, 30d supply, fill #0

## 2021-03-01 MED ORDER — BACITRACIN-POLYMYXIN B 500-10000 UNIT/GM OP OINT
1.0000 "application " | TOPICAL_OINTMENT | Freq: Two times a day (BID) | OPHTHALMIC | 0 refills | Status: DC
  Filled 2021-03-01: qty 3.5, 5d supply, fill #0

## 2021-03-01 MED ORDER — METHOCARBAMOL 500 MG PO TABS
1000.0000 mg | ORAL_TABLET | Freq: Three times a day (TID) | ORAL | 0 refills | Status: DC
  Filled 2021-03-01: qty 180, 30d supply, fill #0

## 2021-03-01 MED ORDER — TRAMADOL HCL 50 MG PO TABS
100.0000 mg | ORAL_TABLET | Freq: Three times a day (TID) | ORAL | 0 refills | Status: DC
  Filled 2021-03-01: qty 42, 7d supply, fill #0

## 2021-03-01 MED ORDER — ACETAMINOPHEN 325 MG PO TABS: 325.0000 mg | ORAL_TABLET | ORAL | Status: DC | PRN

## 2021-03-01 MED ORDER — OXYCODONE HCL 5 MG PO TABS
5.0000 mg | ORAL_TABLET | ORAL | 0 refills | Status: DC | PRN
  Filled 2021-03-01: qty 30, 3d supply, fill #0

## 2021-03-01 MED ORDER — VITAMIN D (ERGOCALCIFEROL) 1.25 MG (50000 UNIT) PO CAPS
50000.0000 [IU] | ORAL_CAPSULE | ORAL | 0 refills | Status: DC
  Filled 2021-03-01: qty 5, 30d supply, fill #0

## 2021-03-01 NOTE — Progress Notes (Signed)
Occupational Therapy Session Note  Patient Details  Name: [name] MRN: [id number] Date of Birth: 03/21/1966  Today's Date: 03/01/2021 OT Individual Time: 4270-6237 OT Individual Time Calculation (min): 54 min    Short Term Goals: Week 2:  OT Short Term Goal 1 (Week 2): STGs = LTGs at Supervision  Skilled Therapeutic Interventions/Progress Updates:    Pt in bed to start session.  He was able to donn his Bledsoe brace on the right knee with setup and then transfer to the EOB with modified independence.  He was able to complete stand pivot transfer to the wheelchair with supervision and then propel himself down to the tub room.  He was able to complete simulated tub transfer using the tub bench and RW with supervision.  He reports having a tub bench already delivered at home as well.  Next, had him complete squat pivot transfers to the therapy mat with modified independence.  Worked on education with stretching of the left shoulder extensors, posterior delt, and internal rotators.  He was able to use a 2 lb dowel rod to assist with AAROM shoulder flexion and abduction as well as for internal rotation when reaching behind him.  Finished session with transfer back to the wheelchair with modified independence squat pivot.  He then rolled himself back to his room with modified independence and he was left sitting up with his spouse present and call button in reach.    Therapy Documentation Precautions:  Precautions Precautions: Fall Precaution Comments: sling for comfort Restrictions Weight Bearing Restrictions: Yes LUE Weight Bearing: Weight bearing as tolerated RLE Weight Bearing: Touchdown weight bearing LLE Weight Bearing: Weight bearing as tolerated Other Position/Activity Restrictions: RLE TDWB, LUE WBAT Pain: Pain Assessment Pain Scale: Faces Pain Score: 0-No pain Faces Pain Scale: Hurts a little bit Pain Type: Acute pain Pain Location: Hip Pain Orientation: Right Pain  Descriptors / Indicators: Discomfort Pain Onset: With Activity Pain Intervention(s): Repositioned ADL: See Care Tool Section for some details of mobility and selfcare  Therapy/Group: Individual Therapy  Katria Botts OTR/L 03/01/2021, 10:40 AM
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
## 2021-03-01 NOTE — Discharge Summary (Signed)
Physical Therapy Discharge Summary  Patient Details  Name: [name] MRN: [id number] Date of Birth: 30-Oct-1965  Today's Date: 03/01/2021 PT Individual Time: 5726-2035 PT Individual Time Calculation (min): 60 min   Patient has met 10 of 10 long term goals due to improved activity tolerance, improved balance, improved postural control, increased strength, increased range of motion, decreased pain and ability to compensate for deficits.  Patient to discharge at an ambulatory level Supervision.   Patient's care partner is independent to provide the necessary physical assistance at discharge.  Reasons goals not met: n/a  Recommendation:  Patient will benefit from ongoing skilled PT services in outpatient setting to continue to advance safe functional mobility, address ongoing impairments in RLE ROM/strength, dynamic standing balance, in order to minimize fall risk.  Equipment: No equipment provided. Pt owns all needed DME at home  Reasons for discharge: treatment goals met and discharge from hospital  Patient/family agrees with progress made and goals achieved: Yes  PT Discharge Precautions/Restrictions Precautions Precautions: Fall Precaution Comments: R knee brace Restrictions Weight Bearing Restrictions: Yes LUE Weight Bearing: Weight bearing as tolerated RLE Weight Bearing: Touchdown weight bearing LLE Weight Bearing: Weight bearing as tolerated Other Position/Activity Restrictions: RLE TDWB, LUE WBAT Vision/Perception  Perception Perception: Within Functional Limits Praxis Praxis: Intact  Cognition Overall Cognitive Status: Within Functional Limits for tasks assessed Arousal/Alertness: Awake/alert Orientation Level: Oriented X4 Attention: Focused;Sustained Focused Attention: Appears intact Sustained Attention: Appears intact Memory: Appears intact Awareness: Appears intact Problem Solving: Appears intact Safety/Judgment: Appears  intact Sensation Sensation Light Touch: Appears Intact Hot/Cold: Appears Intact Proprioception: Appears Intact Stereognosis: Appears Intact Coordination Gross Motor Movements are Fluid and Coordinated: No Fine Motor Movements are Fluid and Coordinated: Yes Coordination and Movement Description: limited d/t WB status and generalized weakness. Also impacted from antalgia from injuries Motor  Motor Motor: Within Functional Limits Motor - Skilled Clinical Observations: WFL but limited by pain at times 2/2 injuries  Mobility Bed Mobility Bed Mobility: Rolling Right;Rolling Left;Sit to Supine;Scooting to [facility];Supine to Sit;Sitting - Scoot to [employer] of Bed Rolling Right: Independent with assistive device Rolling Left: Independent with assistive device Supine to Sit: Supervision/Verbal cueing Sitting - Scoot to Edge of Bed: Supervision/Verbal cueing Sit to Supine: Supervision/Verbal cueing Scooting to HOB: Supervision/Verbal Cueing Transfers Transfers: Sit to Stand;Stand Pivot Transfers;Stand to Sit Sit to Stand: Independent with assistive device Stand to Sit: Independent with assistive device Stand Pivot Transfers: Independent with assistive device Stand Pivot Transfer Details: Verbal cues for safe use of DME/AE;Verbal cues for technique Transfer (Assistive device): Rolling walker Locomotion  Gait Ambulation: Yes Gait Assistance: Independent with assistive device Gait Distance (Feet): 165 Feet Assistive device: Rolling walker Gait Assistance Details: Appropriate awareness and technique with WB status Gait Gait: Yes Gait Pattern: Impaired Gait Pattern: Step-to pattern;Trunk flexed Gait velocity: reduced Stairs / Additional Locomotion Stairs: No Ramp: Supervision/Verbal Location manager Mobility: Yes Wheelchair Assistance: Independent with Camera operator: Both upper extremities Wheelchair Parts Management: Independent Distance: 350+   Trunk/Postural Assessment  Cervical Assessment Cervical Assessment: Within Functional Limits Thoracic Assessment Thoracic Assessment: Within Functional Limits Lumbar Assessment Lumbar Assessment: Within Functional Limits Postural Control Postural Control: Within Functional Limits  Balance Balance Balance Assessed: Yes Dynamic Sitting Balance Dynamic Sitting - Balance Support: Feet unsupported;Right upper extremity supported Dynamic Sitting - Level of Assistance: 7: Independent Dynamic Sitting - Balance Activities: Reaching for objects;Reaching across midline;Lateral lean/weight shifting Static Standing Balance Static Standing - Balance Support: Bilateral upper extremity supported Static Standing - Level  of Assistance: 6: Modified independent (Device/Increase time) Dynamic Standing Balance Dynamic Standing - Balance Support: Bilateral upper extremity supported Dynamic Standing - Level of Assistance: 6: Modified independent (Device/Increase time) Dynamic Standing - Balance Activities: Lateral lean/weight shifting;Forward lean/weight shifting;Reaching for objects Extremity Assessment  RUE Assessment RUE Assessment: Within Functional Limits LUE Assessment LUE Assessment: Exceptions to [facility] General Strength Comments: DNT MMT d/t humeral fx but functional during ADL RLE Assessment RLE Assessment: Exceptions to [facility] Active Range of Motion (AROM) Comments: limited 2/2 pain and current injuries currently in hinged brace General Strength Comments: limited 2/2 pain and bracing however without overpressure pt demonstrates improved strength throughout LLE Assessment LLE Assessment: Within Functional Limits Active Range of Motion (AROM) Comments: WFL however hip flexion slightly limited 2/2 pain General Strength Comments: decreased in L hip flexion 2/2 pain at RLE  Skilled Intervention: Pt received sitting in w/c in rehab gym - handoff of care from OT to start. Pt agreeable to PT tx. He  denies any pain but reports he was premedicated by nursing prior. Lengthy discussion held regarding DC planning, home safety training, WB precautions, pressure relief in w/c, w/c management, etc. Pt verbalized understanding of all and appears confident in ability to return home where wife will be available to assist as needed. He also demonstrates appropriate ability to direct care during session. Pt able to complete sit<>stand transfers from w/c level mod I to RW. He ambulated ~158f mod I with RW with good understanding of WB precautions and appropriate gait speed that was safe and efficient. He was able to complete a car transfer with car height elevated with supervision with good understanding of safety approach - increased difficulty managing RLE into car due to threshold but capable with extra time and effort. He was able to ambulate up/down [id number] with supervision and RW with appropriate understanding of keeping body within walker frame. He propelled his w/c throughout session mod I with no cues needed for safety or management. Once returned to room, he was able to complete ambulatory transfer mod I with RW into bathroom and was continent of bladder in standing. Pt demonstrated ability to stand without UE support while maintaining appropriate WB precautions on RLE. He ended session seated in w/c with all needs in reach. Pt appreciative of education and interventions as well as his care from his therapy team.   ChBraytonT, DPT 03/01/2021, 2:45 PM

## 2021-03-01 NOTE — Progress Notes (Signed)
Occupational Therapy Discharge Summary  Patient Details  Name: Daking Westervelt MRN: [id number] Date of Birth: 01-28-1966   Patient has met 9 of 9 long term goals due to improved activity tolerance, improved balance, postural control, ability to compensate for deficits, functional use of  LEFT upper extremity, improved awareness and improved coordination.  Patient to discharge at overall Supervision level.  Patient's care partner is independent to provide the necessary physical assistance at discharge.  Pt is Supervision with all self care/ADLs and ambulating short distances for functional mobility to/from bathroom Supervision with RW. Pt has had excellent adherence to TDWB status on RLE. Pt able to don brace with set up. Issued reacher and LHS which pt uses. Wife has been present for most sessions for family ed/training.   Reasons goals not met: NA  Recommendation:  Patient will benefit from ongoing skilled OT services in home health setting to continue to advance functional skills in the area of BADL, iADL and Reduce care partner burden. However insurance unable to provide f/u at this time.   Equipment: Pt purchased TTB, BSC, and RW  Reasons for discharge: treatment goals met and discharge from hospital  Patient/family agrees with progress made and goals achieved: Yes  OT Discharge Precautions/Restrictions  Precautions Precautions: Fall Precaution Comments: R knee brace Restrictions Weight Bearing Restrictions: Yes LUE Weight Bearing: Weight bearing as tolerated RLE Weight Bearing: Touchdown weight bearing LLE Weight Bearing: Weight bearing as tolerated Other Position/Activity Restrictions: RLE TDWB, LUE WBAT Pain Pain Assessment Pain Scale: 0-10 Pain Score: 4  Pain Type: Acute pain Pain Location: Hip Pain Orientation: Right Pain Descriptors / Indicators: Aching Pain Frequency: Intermittent Pain Onset: On-going Patients Stated Pain Goal: 1 Pain Intervention(s): Medication  (See eMAR) ADL ADL Equipment Provided: Reacher,Long-handled sponge Eating: Independent Grooming: Setup Upper Body Bathing: Setup Lower Body Bathing: Supervision/safety Where Assessed-Lower Body Bathing: Sitting at sink Upper Body Dressing: Setup Lower Body Dressing: Supervision/safety Toileting: Supervision/safety Toilet Transfer: Distant supervision Toilet Transfer Method: Ambulating Tub/Shower Transfer: Close supervison Tub/Shower Transfer Method: Squat pivot Vision Baseline Vision/History: No visual deficits Patient Visual Report: Other (comment) (unable to open R eye 2/2 injuries) Perception  Perception: Within Functional Limits Praxis Praxis: Intact Cognition Overall Cognitive Status: Within Functional Limits for tasks assessed Arousal/Alertness: Awake/alert Orientation Level: Oriented X4 Focused Attention: Appears intact Sustained Attention: Appears intact Memory: Appears intact Awareness: Appears intact Problem Solving: Appears intact Safety/Judgment: Appears intact Sensation Sensation Light Touch: Appears Intact Coordination Gross Motor Movements are Fluid and Coordinated: No Fine Motor Movements are Fluid and Coordinated: Yes Coordination and Movement Description: limited d/t WB status and generalized weakness Motor  Motor Motor: Within Functional Limits Motor - Skilled Clinical Observations: WFL but limited by pain at times 2/2 injuries Mobility  Bed Mobility Bed Mobility: Rolling Right;Rolling Left;Sit to Supine;Scooting to [facility];Supine to Sit;Sitting - Scoot to [employer] of Bed Rolling Right: Independent with assistive device Rolling Left: Independent with assistive device Supine to Sit: Supervision/Verbal cueing Sitting - Scoot to Edge of Bed: Supervision/Verbal cueing Sit to Supine: Supervision/Verbal cueing Scooting to HOB: Supervision/Verbal Cueing Transfers Sit to Stand: Supervision/Verbal cueing Stand to Sit: Supervision/Verbal cueing  Trunk/Postural  Assessment  Cervical Assessment Cervical Assessment: Within Functional Limits Thoracic Assessment Thoracic Assessment: Within Functional Limits Lumbar Assessment Lumbar Assessment: Exceptions to [facility] Postural Control Postural Control: Deficits on evaluation  Balance Balance Balance Assessed: Yes Dynamic Sitting Balance Dynamic Sitting - Balance Support: Feet unsupported;Right upper extremity supported Dynamic Sitting - Level of Assistance: 7: Independent Dynamic Sitting - Balance Activities:  Reaching for objects;Reaching across midline;Lateral lean/weight shifting Static Standing Balance Static Standing - Balance Support: Right upper extremity supported;Left upper extremity supported Static Standing - Level of Assistance: 5: Stand by assistance Dynamic Standing Balance Dynamic Standing - Balance Support: Right upper extremity supported;Left upper extremity supported Dynamic Standing - Level of Assistance: 5: Stand by assistance Dynamic Standing - Balance Activities: Lateral lean/weight shifting;Forward lean/weight shifting;Reaching for objects Extremity/Trunk Assessment RUE Assessment RUE Assessment: Within Functional Limits LUE Assessment LUE Assessment: Exceptions to [facility] General Strength Comments: DNT MMT d/t humeral fx but functional during ADL   Viona Gilmore 03/01/2021, 12:49 PM

## 2021-03-01 NOTE — Progress Notes (Signed)
Occupational Therapy Session Note  Patient Details  Name: [name] MRN: [id number] Date of Birth: 11/29/1965  Today's Date: 03/01/2021 OT Individual Time: 1347-1415 OT Individual Time Calculation (min): 28 min    Short Term Goals: Week 1:  OT Short Term Goal 1 (Week 1): Pt will perform BSC/toilet transfers w/ AD with Min A OT Short Term Goal 1 - Progress (Week 1): Met OT Short Term Goal 2 (Week 1): Pt will perform LB dressing w/ AE no more than Min A OT Short Term Goal 2 - Progress (Week 1): Progressing toward goal OT Short Term Goal 3 (Week 1): Pt will perform UB/LB bathing with no more than Min A OT Short Term Goal 3 - Progress (Week 1): Met Week 2:  OT Short Term Goal 1 (Week 2): STGs = LTGs at Supervision  Skilled Therapeutic Interventions/Progress Updates:    Pt received in room in w/c and consented to OT tx. Session focused on w/c mobility and BUE strengthening HEP to increase strength and activity tolerance for ADLs. Pt wheeled self down to day room with mod I. Pt trained in blue theraband exercises including tricep extension, chest press, and elbow flexion for 2x20 with min cuing for proper tech with good carryover. Required rest breaks in between sets 2/2 fatigue. Discussed discharge plan with pt, pt has no concerns and reports he is ready for discharge home tomorrow. After tx, pt handed off to PT.   Therapy Documentation Precautions:  Precautions Precautions: Fall Precaution Comments: R knee brace Restrictions Weight Bearing Restrictions: Yes LUE Weight Bearing: Weight bearing as tolerated RLE Weight Bearing: Touchdown weight bearing LLE Weight Bearing: Weight bearing as tolerated Other Position/Activity Restrictions: RLE TDWB, LUE WBAT Vital Signs: Therapy Vitals Temp: 98.2 F (36.8 C) Temp Source: Oral Pulse Rate: 84 Resp: 18 BP: 120/76 Patient Position (if appropriate): Sitting Oxygen Therapy SpO2: 100 % O2 Device: Room Air Pain: Pain Assessment Pain  Scale: 0-10 Pain Score: 4  Pain Type: Acute pain Pain Location: Hip Pain Orientation: Right Pain Descriptors / Indicators: Aching Pain Frequency: Intermittent Pain Onset: On-going Patients Stated Pain Goal: 1 Pain Intervention(s): Medication (See eMAR)   Therapy/Group: Individual Therapy  Amber  Adams 03/01/2021, 1:59 PM  

## 2021-03-01 NOTE — Progress Notes (Signed)
Occupational Therapy Session Note  Patient Details  Name: [name] MRN: [id number] Date of Birth: 06-02-1966  Today's Date: 03/01/2021 OT Individual Time: 9396-8864 OT Individual Time Calculation (min): 53 min    Short Term Goals: Week 1:  OT Short Term Goal 1 (Week 1): Pt will perform BSC/toilet transfers w/ AD with Min A OT Short Term Goal 1 - Progress (Week 1): Met OT Short Term Goal 2 (Week 1): Pt will perform LB dressing w/ AE no more than Min A OT Short Term Goal 2 - Progress (Week 1): Progressing toward goal OT Short Term Goal 3 (Week 1): Pt will perform UB/LB bathing with no more than Min A OT Short Term Goal 3 - Progress (Week 1): Met Week 2:  OT Short Term Goal 1 (Week 2): STGs = LTGs at Supervision   Skilled Therapeutic Interventions/Progress Updates:    Pt greeted at time of OT session sitting up in wheelchair agreeable to OT session, no pain resting but did have pain with RLE mobility later in session, rest breaks provided PRN. Initial part of session spent reviewing techniques for waterproofing incision for home shower. Pt declined ADL today, simulated for skill performance. Self propel room <> day room Mod I avoiding obstacles. Short distance ambulation with Supervision with RW excellent adherence to WB status approx 75 feet in day room. Pt friend entered at this time (staff member), pt able to stand with unilateral support to hug friend before returning to sitting. Sit <> stand Mod I throughout and standing performed the following: single leg RLE hip flexion, abduction, extension, and modified high knee all within tolerance and with bledsoe brace on. Mild hip discomfort with certain movements, modified for comfort. Self propel back to room, set up with call bell in reach all needs met. Pt eager to DC home tomorrow.    Therapy Documentation Precautions:  Precautions Precautions: Fall Precaution Comments: sling for comfort Restrictions Weight Bearing Restrictions:  Yes LUE Weight Bearing: Weight bearing as tolerated RLE Weight Bearing: Touchdown weight bearing LLE Weight Bearing: Weight bearing as tolerated Other Position/Activity Restrictions: RLE TDWB, LUE WBAT     Therapy/Group: Individual Therapy  Viona Gilmore 03/01/2021, 12:38 PM

## 2021-03-02 LAB — COMPREHENSIVE METABOLIC PANEL
ALT: 22 U/L (ref 0–44)
AST: 18 U/L (ref 15–41)
Albumin: 3.2 g/dL — ABNORMAL LOW (ref 3.5–5.0)
Alkaline Phosphatase: 117 U/L (ref 38–126)
Anion gap: 6 (ref 5–15)
BUN: 13 mg/dL (ref 6–20)
CO2: 32 mmol/L (ref 22–32)
Calcium: 9.9 mg/dL (ref 8.9–10.3)
Chloride: 98 mmol/L (ref 98–111)
Creatinine, Ser: 1.13 mg/dL (ref 0.61–1.24)
GFR, Estimated: >60 mL/min (ref >60)
Glucose, Bld: 105 mg/dL — ABNORMAL HIGH (ref 70–99)
Potassium: 4.2 mmol/L (ref 3.5–5.1)
Sodium: 136 mmol/L (ref 135–145)
Total Bilirubin: 0.7 mg/dL (ref 0.3–1.2)
Total Protein: 6.7 g/dL (ref 6.5–8.1)
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
## 2021-03-02 NOTE — Progress Notes (Signed)
Pt discharged via PTAR. Pt has all belongings and wife took additional belongings home. Pt received all medications prior to discharge and discharge instructions were given.

## 2021-03-02 NOTE — Progress Notes (Signed)
Inpatient Rehabilitation Care Coordinator Discharge Note  The overall goal for the admission was met for:   Discharge location: Yes, home  Length of Stay: Yes, 15 Days  Discharge activity level: Yes  Home/community participation: Yes  Services provided included: MD, RD, PT, OT, SLP, RN, CM, TR, Pharmacy, Neuropsych, and SW  Financial Services: Other: unissured  Choices offered to/list presented to:pt and spouse  Follow-up services arranged: Other: TBD/ HEP/ OP   Comments (or additional information): PT OT Rolling Walker   Patient/Family verbalized understanding of follow-up arrangements: Yes  Individual responsible for coordination of the follow-up planJohny Chess, [phone]  Confirmed correct DME delivered: Dyanne Iha 03/02/2021    Dyanne Iha

## 2021-03-02 NOTE — Progress Notes (Signed)
Patient ID: [name], male   DOB: May 03, 1966, 55 y.o.   MRN: [id number]  Patient transportation scheduled at 10AM.  Lavera Guise, [place] [phone]

## 2021-03-02 NOTE — Progress Notes (Signed)
Repeat imaging of right acetabulum and left humerus reviewed. Imaging stable. Continue TDWB RLE and WBAT LUE. Patient stable for discharge home today from ortho standpoint. Plan to have patient follow-up with Dr. Jena Gauss in 2-3 weeks for repeat x-rays and wound check.   Corliss Lamartina A. Ladonna Snide [facility] [phone] (office) [email or web address]

## 2021-03-03 ENCOUNTER — Telehealth: Payer: Self-pay

## 2021-03-03 NOTE — Telephone Encounter (Signed)
Transition Care Management Unsuccessful Follow-up Telephone Call  Date of discharge and from where:  [facility] on 03/02/2021  Attempts:  1st Attempt  Reason for unsuccessful TCM follow-up call:  Left voice message to call back this nurse. Contact nr and info provided Pt has hospital f/u app with NP Bertram Denver on 04/04/2021

## 2021-03-06 ENCOUNTER — Telehealth: Payer: Self-pay

## 2021-03-06 NOTE — Telephone Encounter (Signed)
Transition Care Management Unsuccessful Follow-up Telephone Call   Date of discharge and from where:  [facility] on 03/02/2021   Attempts:  2 nd Attempt   Reason for unsuccessful TCM follow-up call:  Left voice message to call back this nurse. Contact nr and info provided Pt has hospital f/u app with NP Bertram Denver on 04/04/2021

## 2021-03-07 ENCOUNTER — Telehealth: Payer: Self-pay

## 2021-03-07 NOTE — Telephone Encounter (Signed)
Transition Care Management Unsuccessful Follow-up Telephone Call  Date of discharge and from where:  03/02/2021, [facility]  Attempts:  3rd Attempt  Reason for unsuccessful TCM follow-up call:  Left voice message on # [phone].  Patient has appointment with Damon Denver, [name] 04/04/2021.  She is listed as his PCP but he has never been seen by Ms Meredeth Ide, [name] or at [facility]

## 2021-03-09 ENCOUNTER — Telehealth (HOSPITAL_COMMUNITY): Payer: Self-pay

## 2021-03-09 ENCOUNTER — Other Ambulatory Visit (HOSPITAL_COMMUNITY): Payer: Self-pay

## 2021-03-09 NOTE — Telephone Encounter (Signed)
Transitions of Care Pharmacy  ° °Call attempted for a pharmacy transitions of care follow-up. HIPAA appropriate voicemail was left with call back information provided.  ° °Call attempt #1. Will follow-up in 2-3 days.  °  °

## 2021-03-10 ENCOUNTER — Other Ambulatory Visit (HOSPITAL_COMMUNITY): Payer: Self-pay

## 2021-03-10 ENCOUNTER — Encounter: Payer: Self-pay | Admitting: Physical Medicine and Rehabilitation

## 2021-03-10 ENCOUNTER — Telehealth (HOSPITAL_COMMUNITY): Payer: Self-pay | Admitting: Pharmacist

## 2021-03-13 ENCOUNTER — Encounter: Payer: Self-pay | Attending: Physical Medicine and Rehabilitation | Admitting: Physical Medicine and Rehabilitation

## 2021-03-13 ENCOUNTER — Other Ambulatory Visit: Payer: Self-pay

## 2021-03-13 ENCOUNTER — Encounter: Payer: Self-pay | Admitting: Physical Medicine and Rehabilitation

## 2021-03-13 ENCOUNTER — Telehealth (HOSPITAL_COMMUNITY): Payer: Self-pay

## 2021-03-13 VITALS — BP 121/75 | HR 91 | Temp 99.2°F

## 2021-03-13 DIAGNOSIS — S42322D Displaced transverse fracture of shaft of humerus, left arm, subsequent encounter for fracture with routine healing: Secondary | ICD-10-CM | POA: Insufficient documentation

## 2021-03-13 DIAGNOSIS — G8918 Other acute postprocedural pain: Secondary | ICD-10-CM | POA: Insufficient documentation

## 2021-03-13 DIAGNOSIS — S0591XD Unspecified injury of right eye and orbit, subsequent encounter: Secondary | ICD-10-CM | POA: Insufficient documentation

## 2021-03-13 DIAGNOSIS — S32441D Displaced fracture of posterior column [ilioischial] of right acetabulum, subsequent encounter for fracture with routine healing: Secondary | ICD-10-CM | POA: Insufficient documentation

## 2021-03-13 DIAGNOSIS — S0591XA Unspecified injury of right eye and orbit, initial encounter: Secondary | ICD-10-CM | POA: Insufficient documentation

## 2021-03-13 DIAGNOSIS — T07XXXA Unspecified multiple injuries, initial encounter: Secondary | ICD-10-CM | POA: Insufficient documentation

## 2021-03-13 MED ORDER — PREGABALIN 50 MG PO CAPS: 50.0000 mg | ORAL_CAPSULE | Freq: Two times a day (BID) | ORAL | 5 refills | Status: DC

## 2021-03-13 MED ORDER — METHOCARBAMOL 500 MG PO TABS
1000.0000 mg | ORAL_TABLET | Freq: Three times a day (TID) | ORAL | 5 refills | Status: DC
  Filled 2021-04-10: qty 180, 30d supply, fill #0

## 2021-03-13 MED ORDER — TRAMADOL HCL 50 MG PO TABS: 100.0000 mg | ORAL_TABLET | Freq: Three times a day (TID) | ORAL | 3 refills | Status: DC
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
## 2021-03-13 NOTE — Telephone Encounter (Signed)
Transitions of Care Pharmacy   Call attempted for a pharmacy transitions of care follow-up. HIPAA appropriate voicemail was left with call back information provided.   Call attempt #2. Will follow-up in 2-3 days.    
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
## 2021-04-04 ENCOUNTER — Encounter: Payer: Self-pay | Admitting: Nurse Practitioner

## 2021-04-04 ENCOUNTER — Other Ambulatory Visit: Payer: Self-pay

## 2021-04-04 ENCOUNTER — Ambulatory Visit: Payer: Self-pay | Attending: Nurse Practitioner | Admitting: Nurse Practitioner

## 2021-04-04 DIAGNOSIS — D696 Thrombocytopenia, unspecified: Secondary | ICD-10-CM

## 2021-04-04 DIAGNOSIS — R7309 Other abnormal glucose: Secondary | ICD-10-CM

## 2021-04-04 DIAGNOSIS — E559 Vitamin D deficiency, unspecified: Secondary | ICD-10-CM

## 2021-04-04 DIAGNOSIS — Z125 Encounter for screening for malignant neoplasm of prostate: Secondary | ICD-10-CM

## 2021-04-04 DIAGNOSIS — E785 Hyperlipidemia, unspecified: Secondary | ICD-10-CM

## 2021-04-04 DIAGNOSIS — E890 Postprocedural hypothyroidism: Secondary | ICD-10-CM

## 2021-04-04 DIAGNOSIS — E871 Hypo-osmolality and hyponatremia: Secondary | ICD-10-CM

## 2021-04-04 DIAGNOSIS — Z7689 Persons encountering health services in other specified circumstances: Secondary | ICD-10-CM

## 2021-04-04 MED ORDER — LEVOTHYROXINE SODIUM 125 MCG PO TABS
125.0000 ug | ORAL_TABLET | Freq: Every morning | ORAL | 0 refills | Status: DC
  Filled 2021-04-10: qty 30, 30d supply, fill #0
  Filled 2021-05-15: qty 30, 30d supply, fill #1
  Filled 2021-06-16: qty 30, 30d supply, fill #2

## 2021-04-04 MED ORDER — VITAMIN D (ERGOCALCIFEROL) 1.25 MG (50000 UNIT) PO CAPS: 50000.0000 [IU] | ORAL_CAPSULE | ORAL | 0 refills | Status: DC
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
## 2021-04-05 ENCOUNTER — Encounter (INDEPENDENT_AMBULATORY_CARE_PROVIDER_SITE_OTHER): Payer: Self-pay

## 2021-04-05 NOTE — Addendum Note (Signed)
Addended byMemory Dance on: 04/05/2021 12:13 PM   Modules accepted: Orders

## 2021-04-06 LAB — CBC
Hematocrit: 39 % (ref 37.5–51.0)
Hemoglobin: 12.6 g/dL — ABNORMAL LOW (ref 13.0–17.7)
MCH: 28.9 pg (ref 26.6–33.0)
MCHC: 32.3 g/dL (ref 31.5–35.7)
MCV: 89 fL (ref 79–97)
Platelets: 333 10*3/uL (ref 150–450)
RBC: 4.36 x10E6/uL (ref 4.14–5.80)
RDW: 13.2 % (ref 11.6–15.4)
WBC: 5.1 10*3/uL (ref 3.4–10.8)

## 2021-04-06 LAB — CMP14+EGFR
ALT: 10 IU/L (ref 0–44)
AST: 13 IU/L (ref 0–40)
Albumin/Globulin Ratio: 1.7 (ref 1.2–2.2)
Albumin: 4.7 g/dL (ref 3.8–4.9)
Alkaline Phosphatase: 135 IU/L — ABNORMAL HIGH (ref 44–121)
BUN/Creatinine Ratio: 12 (ref 9–20)
BUN: 12 mg/dL (ref 6–24)
Bilirubin Total: 0.4 mg/dL (ref 0.0–1.2)
CO2: 25 mmol/L (ref 20–29)
Calcium: 10.5 mg/dL — ABNORMAL HIGH (ref 8.7–10.2)
Chloride: 97 mmol/L (ref 96–106)
Creatinine, Ser: 1.03 mg/dL (ref 0.76–1.27)
Globulin, Total: 2.7 g/dL (ref 1.5–4.5)
Glucose: 99 mg/dL (ref 65–99)
Potassium: 4.4 mmol/L (ref 3.5–5.2)
Sodium: 137 mmol/L (ref 134–144)
Total Protein: 7.4 g/dL (ref 6.0–8.5)
eGFR: 86 mL/min/{1.73_m2} (ref >59)

## 2021-04-06 LAB — HEMOGLOBIN A1C
Est. average glucose Bld gHb Est-mCnc: 108 mg/dL
Hgb A1c MFr Bld: 5.4 % (ref 4.8–5.6)

## 2021-04-06 LAB — LIPID PANEL
Chol/HDL Ratio: 4.3 ratio (ref 0.0–5.0)
Cholesterol, Total: 187 mg/dL (ref 100–199)
HDL: 43 mg/dL (ref >39)
LDL Chol Calc (NIH): 116 mg/dL — ABNORMAL HIGH (ref 0–99)
Triglycerides: 156 mg/dL — ABNORMAL HIGH (ref 0–149)
VLDL Cholesterol Cal: 28 mg/dL (ref 5–40)

## 2021-04-06 LAB — TSH: TSH: 5.65 u[IU]/mL — ABNORMAL HIGH (ref 0.450–4.500)

## 2021-04-06 LAB — PSA: Prostate Specific Ag, Serum: 4.1 ng/mL — ABNORMAL HIGH (ref 0.0–4.0)

## 2021-04-06 LAB — VITAMIN D 25 HYDROXY (VIT D DEFICIENCY, FRACTURES): Vit D, 25-Hydroxy: 33.1 ng/mL (ref 30.0–100.0)

## 2021-04-07 ENCOUNTER — Telehealth: Payer: Self-pay

## 2021-04-07 NOTE — Telephone Encounter (Signed)
NO vm set up at this time.

## 2021-04-07 NOTE — Telephone Encounter (Signed)
-----  Message from Gildardo Pounds, NP sent at 04/07/2021  2:27 PM EDT ----- Although hemoglobin has improved patient needs to have his wife pick up a stool kit from the pharmacy at her convenience for colon cancer screening.  Cholesterol levels are slightly elevated.  Work on a low-fat low-cholesterol diet.  PSA for prostate cancer screening is slightly elevated but does not warrant any additional work-up at this time.  We will repeat in 6 months.  Thyroid level is slightly elevated continue current dose of levothyroxine or Synthroid.  We will need to repeat in about 6 weeks.  Please make a lab appointment.

## 2021-04-10 ENCOUNTER — Other Ambulatory Visit: Payer: Self-pay

## 2021-04-10 ENCOUNTER — Ambulatory Visit: Payer: Self-pay | Attending: Nurse Practitioner

## 2021-04-26 ENCOUNTER — Ambulatory Visit (HOSPITAL_BASED_OUTPATIENT_CLINIC_OR_DEPARTMENT_OTHER): Admit: 2021-04-26 | Payer: Self-pay | Admitting: Orthopaedic Surgery

## 2021-04-26 ENCOUNTER — Encounter (HOSPITAL_BASED_OUTPATIENT_CLINIC_OR_DEPARTMENT_OTHER): Payer: Self-pay

## 2021-04-26 SURGERY — ARTHROSCOPY, KNEE, WITH PCL RECONSTRUCTION
Anesthesia: Choice | Site: Knee | Laterality: Right

## 2021-05-03 ENCOUNTER — Encounter (HOSPITAL_BASED_OUTPATIENT_CLINIC_OR_DEPARTMENT_OTHER): Payer: Self-pay | Admitting: Orthopaedic Surgery

## 2021-05-03 ENCOUNTER — Other Ambulatory Visit: Payer: Self-pay
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
## 2021-05-10 ENCOUNTER — Other Ambulatory Visit (HOSPITAL_COMMUNITY): Payer: Self-pay

## 2021-05-10 ENCOUNTER — Encounter (HOSPITAL_BASED_OUTPATIENT_CLINIC_OR_DEPARTMENT_OTHER)
Admission: RE | Disposition: A | Discharge status: Home / Self Care | Payer: Self-pay | Source: Home / Self Care | Attending: Orthopaedic Surgery

## 2021-05-10 ENCOUNTER — Encounter (HOSPITAL_BASED_OUTPATIENT_CLINIC_OR_DEPARTMENT_OTHER): Payer: Self-pay | Admitting: Orthopaedic Surgery

## 2021-05-10 ENCOUNTER — Ambulatory Visit (HOSPITAL_BASED_OUTPATIENT_CLINIC_OR_DEPARTMENT_OTHER): Payer: Self-pay | Admitting: Anesthesiology

## 2021-05-10 ENCOUNTER — Ambulatory Visit (HOSPITAL_BASED_OUTPATIENT_CLINIC_OR_DEPARTMENT_OTHER)
Admission: RE | Admit: 2021-05-10 | Discharge: 2021-05-10 | Disposition: A | Discharge status: Home / Self Care | Payer: Self-pay | Attending: Orthopaedic Surgery | Admitting: Orthopaedic Surgery

## 2021-05-10 ENCOUNTER — Ambulatory Visit (HOSPITAL_BASED_OUTPATIENT_CLINIC_OR_DEPARTMENT_OTHER): Payer: Self-pay

## 2021-05-10 ENCOUNTER — Other Ambulatory Visit: Payer: Self-pay

## 2021-05-10 DIAGNOSIS — Z79890 Hormone replacement therapy (postmenopausal): Secondary | ICD-10-CM | POA: Insufficient documentation

## 2021-05-10 DIAGNOSIS — S83411A Sprain of medial collateral ligament of right knee, initial encounter: Secondary | ICD-10-CM | POA: Insufficient documentation

## 2021-05-10 DIAGNOSIS — E0590 Thyrotoxicosis, unspecified without thyrotoxic crisis or storm: Secondary | ICD-10-CM | POA: Insufficient documentation

## 2021-05-10 DIAGNOSIS — S83521A Sprain of posterior cruciate ligament of right knee, initial encounter: Secondary | ICD-10-CM | POA: Insufficient documentation

## 2021-05-10 DIAGNOSIS — Z79899 Other long term (current) drug therapy: Secondary | ICD-10-CM | POA: Insufficient documentation

## 2021-05-10 DIAGNOSIS — S83281A Other tear of lateral meniscus, current injury, right knee, initial encounter: Secondary | ICD-10-CM | POA: Insufficient documentation

## 2021-05-10 DIAGNOSIS — Z923 Personal history of irradiation: Secondary | ICD-10-CM | POA: Insufficient documentation

## 2021-05-10 DIAGNOSIS — X58XXXA Exposure to other specified factors, initial encounter: Secondary | ICD-10-CM | POA: Insufficient documentation

## 2021-05-10 HISTORY — PX: KNEE ARTHROSCOPY WITH POSTERIOR CRUCIATE LIGAMENT (PCL) RECONSTRUCTION: SHX5657

## 2021-05-10 SURGERY — ARTHROSCOPY, KNEE, WITH PCL RECONSTRUCTION
Anesthesia: General | Site: Knee | Laterality: Right

## 2021-05-10 MED ORDER — DEXAMETHASONE SODIUM PHOSPHATE 10 MG/ML IJ SOLN
INTRAMUSCULAR | Status: AC
  Filled 2021-05-10: qty 1

## 2021-05-10 MED ORDER — MIDAZOLAM HCL 2 MG/2ML IJ SOLN
2.0000 mg | Freq: Once | INTRAMUSCULAR | Status: AC
  Administered 2021-05-10: 2 mg via INTRAVENOUS

## 2021-05-10 MED ORDER — LIDOCAINE HCL (PF) 2 % IJ SOLN
INTRAMUSCULAR | Status: DC | PRN
  Administered 2021-05-10: 80 mg via INTRADERMAL

## 2021-05-10 MED ORDER — METOPROLOL TARTRATE 5 MG/5ML IV SOLN
INTRAVENOUS | Status: DC | PRN
  Administered 2021-05-10 (×2): 1 mg via INTRAVENOUS

## 2021-05-10 MED ORDER — MIDAZOLAM HCL 5 MG/5ML IJ SOLN
INTRAMUSCULAR | Status: DC | PRN
  Administered 2021-05-10: 2 mg via INTRAVENOUS

## 2021-05-10 MED ORDER — ONDANSETRON HCL 4 MG/2ML IJ SOLN
INTRAMUSCULAR | Status: DC | PRN
Start: 2021-05-10 — End: 2021-05-10
  Administered 2021-05-10: 4 mg via INTRAVENOUS

## 2021-05-10 MED ORDER — FENTANYL CITRATE (PF) 100 MCG/2ML IJ SOLN
INTRAMUSCULAR | Status: AC
  Filled 2021-05-10: qty 2

## 2021-05-10 MED ORDER — PHENYLEPHRINE 40 MCG/ML (10ML) SYRINGE FOR IV PUSH (FOR BLOOD PRESSURE SUPPORT)
PREFILLED_SYRINGE | INTRAVENOUS | Status: AC
  Filled 2021-05-10: qty 10

## 2021-05-10 MED ORDER — BUPIVACAINE HCL (PF) 0.25 % IJ SOLN
INTRAMUSCULAR | Status: AC
  Filled 2021-05-10: qty 30

## 2021-05-10 MED ORDER — SODIUM CHLORIDE 0.9 % IR SOLN
Status: DC | PRN
  Administered 2021-05-10: 6000 mL

## 2021-05-10 MED ORDER — GLYCOPYRROLATE PF 0.2 MG/ML IJ SOSY
PREFILLED_SYRINGE | INTRAMUSCULAR | Status: AC
  Filled 2021-05-10: qty 1

## 2021-05-10 MED ORDER — EPHEDRINE 5 MG/ML INJ
INTRAVENOUS | Status: AC
  Filled 2021-05-10: qty 10

## 2021-05-10 MED ORDER — PROPOFOL 10 MG/ML IV BOLUS
INTRAVENOUS | Status: AC
  Filled 2021-05-10: qty 20

## 2021-05-10 MED ORDER — ROPIVACAINE HCL 5 MG/ML IJ SOLN
INTRAMUSCULAR | Status: DC | PRN
  Administered 2021-05-10: 30 mL via PERINEURAL

## 2021-05-10 MED ORDER — HYDROMORPHONE HCL 1 MG/ML IJ SOLN
INTRAMUSCULAR | Status: AC
  Filled 2021-05-10: qty 1

## 2021-05-10 MED ORDER — MIDAZOLAM HCL 2 MG/2ML IJ SOLN
INTRAMUSCULAR | Status: AC
  Filled 2021-05-10: qty 2

## 2021-05-10 MED ORDER — BUPIVACAINE HCL (PF) 0.25 % IJ SOLN
INTRAMUSCULAR | Status: DC | PRN
  Administered 2021-05-10: 20 mL

## 2021-05-10 MED ORDER — FENTANYL CITRATE (PF) 100 MCG/2ML IJ SOLN
25.0000 ug | INTRAMUSCULAR | Status: DC | PRN
  Administered 2021-05-10 (×3): 50 ug via INTRAVENOUS

## 2021-05-10 MED ORDER — ASPIRIN 81 MG PO CHEW
81.0000 mg | CHEWABLE_TABLET | Freq: Two times a day (BID) | ORAL | 0 refills | Status: AC
  Filled 2021-05-10: qty 84, 42d supply, fill #0

## 2021-05-10 MED ORDER — ACETAMINOPHEN 500 MG PO TABS
1000.0000 mg | ORAL_TABLET | Freq: Three times a day (TID) | ORAL | 0 refills | Status: AC
  Filled 2021-05-10: qty 84, 14d supply, fill #0

## 2021-05-10 MED ORDER — MORPHINE SULFATE (PF) 4 MG/ML IV SOLN
INTRAVENOUS | Status: AC
  Filled 2021-05-10: qty 1

## 2021-05-10 MED ORDER — ACETAMINOPHEN 500 MG PO TABS
1000.0000 mg | ORAL_TABLET | Freq: Once | ORAL | Status: AC
  Administered 2021-05-10: 1000 mg via ORAL

## 2021-05-10 MED ORDER — CLONIDINE HCL (ANALGESIA) 100 MCG/ML EP SOLN
EPIDURAL | Status: DC | PRN
  Administered 2021-05-10: 100 ug

## 2021-05-10 MED ORDER — DEXAMETHASONE SODIUM PHOSPHATE 4 MG/ML IJ SOLN
INTRAMUSCULAR | Status: DC | PRN
  Administered 2021-05-10: 10 mg via INTRAVENOUS

## 2021-05-10 MED ORDER — ONDANSETRON HCL 4 MG PO TABS
4.0000 mg | ORAL_TABLET | Freq: Three times a day (TID) | ORAL | 0 refills | Status: AC | PRN
  Filled 2021-05-10: qty 10, 4d supply, fill #0

## 2021-05-10 MED ORDER — OXYCODONE HCL 5 MG PO TABS
ORAL_TABLET | ORAL | 0 refills | Status: AC
  Filled 2021-05-10: qty 30, 5d supply, fill #0

## 2021-05-10 MED ORDER — CELECOXIB 100 MG PO CAPS
100.0000 mg | ORAL_CAPSULE | Freq: Two times a day (BID) | ORAL | 0 refills | Status: AC
  Filled 2021-05-10: qty 60, 30d supply, fill #0

## 2021-05-10 MED ORDER — METHOCARBAMOL 500 MG PO TABS
500.0000 mg | ORAL_TABLET | Freq: Three times a day (TID) | ORAL | 0 refills | Status: DC | PRN
  Filled 2021-05-10: qty 30, 10d supply, fill #0

## 2021-05-10 MED ORDER — METOPROLOL TARTRATE 5 MG/5ML IV SOLN
INTRAVENOUS | Status: AC
  Filled 2021-05-10: qty 5

## 2021-05-10 MED ORDER — VANCOMYCIN HCL 1000 MG IV SOLR
INTRAVENOUS | Status: DC | PRN
  Administered 2021-05-10: 1000 mg

## 2021-05-10 MED ORDER — HYDROMORPHONE HCL 1 MG/ML IJ SOLN
INTRAMUSCULAR | Status: DC | PRN
  Administered 2021-05-10 (×2): .5 mg via INTRAVENOUS

## 2021-05-10 MED ORDER — CEFAZOLIN SODIUM-DEXTROSE 2-4 GM/100ML-% IV SOLN
2.0000 g | INTRAVENOUS | Status: AC
  Administered 2021-05-10: 2 g via INTRAVENOUS

## 2021-05-10 MED ORDER — PROMETHAZINE HCL 25 MG/ML IJ SOLN: 6.2500 mg | INTRAMUSCULAR | Status: DC | PRN

## 2021-05-10 MED ORDER — PHENYLEPHRINE HCL (PRESSORS) 10 MG/ML IV SOLN
INTRAVENOUS | Status: DC | PRN
  Administered 2021-05-10: 80 ug via INTRAVENOUS

## 2021-05-10 MED ORDER — ONDANSETRON HCL 4 MG/2ML IJ SOLN
INTRAMUSCULAR | Status: AC
  Filled 2021-05-10: qty 2

## 2021-05-10 MED ORDER — PROPOFOL 10 MG/ML IV BOLUS
INTRAVENOUS | Status: DC | PRN
  Administered 2021-05-10: 50 mg via INTRAVENOUS
  Administered 2021-05-10: 150 mg via INTRAVENOUS

## 2021-05-10 MED ORDER — FENTANYL CITRATE (PF) 100 MCG/2ML IJ SOLN
INTRAMUSCULAR | Status: DC | PRN
  Administered 2021-05-10: 25 ug via INTRAVENOUS
  Administered 2021-05-10 (×3): 50 ug via INTRAVENOUS
  Administered 2021-05-10: 25 ug via INTRAVENOUS

## 2021-05-10 MED ORDER — GABAPENTIN 100 MG PO CAPS
100.0000 mg | ORAL_CAPSULE | Freq: Three times a day (TID) | ORAL | 0 refills | Status: AC
  Filled 2021-05-10: qty 42, 14d supply, fill #0

## 2021-05-10 MED ORDER — GLYCOPYRROLATE 0.2 MG/ML IJ SOLN
INTRAMUSCULAR | Status: DC | PRN
  Administered 2021-05-10: .2 mg via INTRAVENOUS

## 2021-05-10 MED ORDER — FENTANYL CITRATE (PF) 100 MCG/2ML IJ SOLN
50.0000 ug | Freq: Once | INTRAMUSCULAR | Status: AC
  Administered 2021-05-10: 50 ug via INTRAVENOUS

## 2021-05-10 MED ORDER — LIDOCAINE HCL (PF) 2 % IJ SOLN
INTRAMUSCULAR | Status: AC
  Filled 2021-05-10: qty 5

## 2021-05-10 MED ORDER — CEFAZOLIN SODIUM-DEXTROSE 2-4 GM/100ML-% IV SOLN
INTRAVENOUS | Status: AC
  Filled 2021-05-10: qty 100

## 2021-05-10 MED ORDER — LACTATED RINGERS IV SOLN: INTRAVENOUS | Status: DC

## 2021-05-10 SURGICAL SUPPLY — 109 items
ALLOGRAFT GRFTLNK IMPLANT SYST (Anchor) IMPLANT
ANCH SUT SWLK 19.1X5.5 CLS EL (Anchor) ×1 IMPLANT
ANCHOR BUTTON TIGHTROPE RN 14 (Anchor) ×2 IMPLANT
ANCHOR PEEK SWIVEL LOCK 5.5 (Anchor) ×2 IMPLANT
APL PRP STRL LF DISP 70% ISPRP (MISCELLANEOUS) ×1
BLADE AVERAGE 25MMX9MM (BLADE) ×2
BLADE AVERAGE 25X9 (BLADE) ×3 IMPLANT
BLADE HEX COATED 2.75 (ELECTRODE) ×3 IMPLANT
BLADE SHAVER BONE 5.0MM X 13CM (MISCELLANEOUS) ×2
BLADE SHAVER BONE 5.0X13 (MISCELLANEOUS) ×3 IMPLANT
BLADE SURG 10 STRL SS (BLADE) ×3 IMPLANT
BLADE SURG 15 STRL LF DISP TIS (BLADE) ×1 IMPLANT
BLADE SURG 15 STRL SS (BLADE) ×3
BNDG COHESIVE 4X5 TAN ST LF (GAUZE/BANDAGES/DRESSINGS) IMPLANT
BNDG ELASTIC 6X5.8 VLCR STR LF (GAUZE/BANDAGES/DRESSINGS) ×3 IMPLANT
BONE TUNNEL PLUG CANNULATED (MISCELLANEOUS) IMPLANT
BURR OVAL 8 FLU 4.0MM X 13CM (MISCELLANEOUS)
BURR OVAL 8 FLU 4.0X13 (MISCELLANEOUS) IMPLANT
CANNULA PASSPORT BUTTON 10-40 (CANNULA) ×3 IMPLANT
CANNULA TWIST IN 8.25X7CM (CANNULA) ×2 IMPLANT
CHLORAPREP W/TINT 26 (MISCELLANEOUS) ×3 IMPLANT
CLOSURE STERI-STRIP 1/2X4 (GAUZE/BANDAGES/DRESSINGS) ×2
CLSR STERI-STRIP ANTIMIC 1/2X4 (GAUZE/BANDAGES/DRESSINGS) ×2 IMPLANT
COOLER ICEMAN CLASSIC (MISCELLANEOUS) ×3 IMPLANT
COVER BACK TABLE 60X90IN (DRAPES) ×3 IMPLANT
CUFF TOURN SGL QUICK 34 (TOURNIQUET CUFF) ×3
CUFF TRNQT CYL 34X4.125X (TOURNIQUET CUFF) ×1 IMPLANT
CUTTER TENSIONER SUT 2-0 0 FBW (INSTRUMENTS) IMPLANT
DECANTER SPIKE VIAL GLASS SM (MISCELLANEOUS) IMPLANT
DISSECTOR 3.5MM X 13CM CVD (MISCELLANEOUS) ×1 IMPLANT
DISSECTOR 4.0MMX13CM CVD (MISCELLANEOUS) ×3 IMPLANT
DRAIN PENROSE 12X.25 LTX STRL (MISCELLANEOUS) IMPLANT
DRAPE ARTHROSCOPY W/POUCH 90 (DRAPES) ×3 IMPLANT
DRAPE IMP U-DRAPE 54X76 (DRAPES) ×1 IMPLANT
DRAPE OEC MINIVIEW 54X84 (DRAPES) ×3 IMPLANT
DRAPE TOP ARMCOVERS (MISCELLANEOUS) ×1 IMPLANT
DRAPE U-SHAPE 47X51 STRL (DRAPES) ×3 IMPLANT
DRILL FLIPCUTTER III 6-12 (ORTHOPEDIC DISPOSABLE SUPPLIES) IMPLANT
DW OUTFLOW CASSETTE/TUBE SET (MISCELLANEOUS) ×1 IMPLANT
ELECT REM PT RETURN 9FT ADLT (ELECTROSURGICAL) ×3
ELECTRODE REM PT RTRN 9FT ADLT (ELECTROSURGICAL) ×1 IMPLANT
FIBER TAPE 2MM (SUTURE) IMPLANT
FIBERSTICK 2 (SUTURE) ×2 IMPLANT
FLIPCUTTER III 6-12 AR-1204FF (ORTHOPEDIC DISPOSABLE SUPPLIES) ×3
GAUZE SPONGE 4X4 12PLY STRL (GAUZE/BANDAGES/DRESSINGS) ×6 IMPLANT
GLOVE SRG 8 PF TXTR STRL LF DI (GLOVE) ×1 IMPLANT
GLOVE SURG ENC MOIS LTX SZ6.5 (GLOVE) ×3 IMPLANT
GLOVE SURG LTX SZ8 (GLOVE) ×5 IMPLANT
GLOVE SURG UNDER POLY LF SZ6.5 (GLOVE) ×3 IMPLANT
GLOVE SURG UNDER POLY LF SZ8 (GLOVE) ×6
GOWN STRL REUS W/ TWL LRG LVL3 (GOWN DISPOSABLE) ×2 IMPLANT
GOWN STRL REUS W/ TWL XL LVL3 (GOWN DISPOSABLE) ×1 IMPLANT
GOWN STRL REUS W/TWL LRG LVL3 (GOWN DISPOSABLE) ×6
GOWN STRL REUS W/TWL XL LVL3 (GOWN DISPOSABLE) ×5 IMPLANT
GRAFT ACHILLES CALC BNE BLCK (Bone Implant) IMPLANT
GRAFT ACHILLES TENDON (Bone Implant) ×3 IMPLANT
GRAFT TISS 65-80 FRZN TENDON (Tissue) IMPLANT
GUIDEPIN FLEX PATHFINDER 2.4MM (WIRE) ×1 IMPLANT
IMMOBILIZER KNEE 20 (SOFTGOODS)
IMMOBILIZER KNEE 20 THIGH 36 (SOFTGOODS) IMPLANT
IMMOBILIZER KNEE 22 UNIV (SOFTGOODS) IMPLANT
IMMOBILIZER KNEE 24 THIGH 36 (MISCELLANEOUS) IMPLANT
IMMOBILIZER KNEE 24 UNIV (MISCELLANEOUS)
IV NS IRRIG 3000ML ARTHROMATIC (IV SOLUTION) ×12 IMPLANT
KIT LEG STABILIZATION (KITS) IMPLANT
KIT TRANSTIBIAL (DISPOSABLE) ×3 IMPLANT
LOOP VESSEL MAXI BLUE (MISCELLANEOUS) ×3 IMPLANT
MANIFOLD NEPTUNE II (INSTRUMENTS) ×3 IMPLANT
NDL SAFETY ECLIPSE 18X1.5 (NEEDLE) ×1 IMPLANT
NEEDLE HYPO 18GX1.5 SHARP (NEEDLE)
NS IRRIG 1000ML POUR BTL (IV SOLUTION) ×3 IMPLANT
PACK ARTHROSCOPY DSU (CUSTOM PROCEDURE TRAY) ×3 IMPLANT
PACK BASIN DAY SURGERY FS (CUSTOM PROCEDURE TRAY) ×3 IMPLANT
PAD CAST 4YDX4 CTTN HI CHSV (CAST SUPPLIES) ×1 IMPLANT
PAD COLD SHLDR WRAP-ON (PAD) ×3 IMPLANT
PADDING CAST COTTON 4X4 STRL (CAST SUPPLIES) ×3
PENCIL SMOKE EVACUATOR (MISCELLANEOUS) ×3 IMPLANT
PK GRAFTLINK ALLO IMPLANT SYST (Anchor) ×3 IMPLANT
PORT APPOLLO RF 90DEGREE MULTI (SURGICAL WAND) ×3 IMPLANT
SCREW FT BIOCOMP 9X30 (Screw) ×2 IMPLANT
SCREW INTERFERENCE 8X20MM (Screw) ×2 IMPLANT
SHEET MEDIUM DRAPE 40X70 STRL (DRAPES) ×3 IMPLANT
SLEEVE SCD COMPRESS KNEE MED (STOCKING) ×3 IMPLANT
SPONGE T-LAP 18X18 ~~LOC~~+RFID (SPONGE) ×3 IMPLANT
SPONGE T-LAP 4X18 ~~LOC~~+RFID (SPONGE) ×1 IMPLANT
SUT 0 FIBERLOOP 38 BLUE TPR ND (SUTURE)
SUT FIBERWIRE #2 38 T-5 BLUE (SUTURE) ×6
SUT MNCRL AB 4-0 PS2 18 (SUTURE) ×5 IMPLANT
SUT VIC AB 0 CT1 27 (SUTURE) ×6
SUT VIC AB 0 CT1 27XBRD ANBCTR (SUTURE) ×1 IMPLANT
SUT VIC AB 1 CT1 27 (SUTURE)
SUT VIC AB 1 CT1 27XBRD ANBCTR (SUTURE) IMPLANT
SUT VIC AB 2-0 SH 27 (SUTURE)
SUT VIC AB 2-0 SH 27XBRD (SUTURE) ×1 IMPLANT
SUT VIC AB 3-0 SH 27 (SUTURE) ×6
SUT VIC AB 3-0 SH 27X BRD (SUTURE) ×1 IMPLANT
SUTURE 0 FIBERLP 38 BLU TPR ND (SUTURE) IMPLANT
SUTURE FIBERWR #2 38 T-5 BLUE (SUTURE) IMPLANT
SUTURE TAPE 1.3 FIBERLOP 20 ST (SUTURE) ×2 IMPLANT
SUTURETAPE 1.3 FIBERLOOP 20 ST (SUTURE) ×6
SYR 5ML LL (SYRINGE) ×1 IMPLANT
SYR 5ML LUER SLIP (SYRINGE) ×1 IMPLANT
TISSUE GRAFTLINK 65-95MML (Tissue) ×3 IMPLANT
TOWEL GREEN STERILE FF (TOWEL DISPOSABLE) ×7 IMPLANT
TUBE CONNECTING 20'X1/4 (TUBING) ×1
TUBE CONNECTING 20X1/4 (TUBING) ×2 IMPLANT
TUBE SUCTION HIGH CAP CLEAR NV (SUCTIONS) ×3 IMPLANT
TUBING ARTHROSCOPY IRRIG 16FT (MISCELLANEOUS) ×3 IMPLANT
WRAP KNEE MAXI GEL POST OP (GAUZE/BANDAGES/DRESSINGS) ×1 IMPLANT

## 2021-05-10 NOTE — Anesthesia Procedure Notes (Signed)
Procedure Name: LMA Insertion Date/Time: 05/10/2021 11:09 AM Performed by: Alford Highland, CRNA Pre-anesthesia Checklist: Patient identified, Emergency Drugs available, Suction available and Patient being monitored Patient Re-evaluated:Patient Re-evaluated prior to induction Oxygen Delivery Method: Circle System Utilized Preoxygenation: Pre-oxygenation with 100% oxygen Induction Type: IV induction Ventilation: Mask ventilation without difficulty LMA: LMA inserted LMA Size: 4.0 Number of attempts: 1 Airway Equipment and Method: Bite block Placement Confirmation: positive ETCO2 Tube secured with: Tape Dental Injury: Teeth and Oropharynx as per pre-operative assessment

## 2021-05-10 NOTE — Anesthesia Postprocedure Evaluation (Signed)
Anesthesia Post Note  Patient: [name]  Procedure(s) Performed: KNEE ARTHROSCOPY WITH POSTERIOR CRUCIATE LIGAMENT (PCL) RECONSTRUCTION (Right: Knee) POSTERIOR LATERAL CORNER RECONSTRUCTION (Right: Knee)     Patient location during evaluation: PACU Anesthesia Type: General Level of consciousness: awake and alert and oriented Pain management: pain level controlled Vital Signs Assessment: post-procedure vital signs reviewed and stable Respiratory status: spontaneous breathing, nonlabored ventilation and respiratory function stable Cardiovascular status: blood pressure returned to baseline and stable Postop Assessment: no apparent nausea or vomiting Anesthetic complications: no   No notable events documented.  Last Vitals:  Vitals:   05/10/21 1426 05/10/21 1506  BP: (!) 149/100 (!) 143/93  Pulse: 93 100  Resp: 12 14  Temp:  36.7 C  SpO2: 99% 97%    Last Pain:  Vitals:   05/10/21 1444  TempSrc:   PainSc: 4                  [name] A.

## 2021-05-10 NOTE — Progress Notes (Signed)
Assisted Dr. Turk with right, ultrasound guided, adductor canal block. Side rails up, monitors on throughout procedure. See vital signs in flow sheet. Tolerated Procedure well.  

## 2021-05-10 NOTE — Transfer of Care (Signed)
Immediate Anesthesia Transfer of Care Note  Patient: Charlies Rayburn  Procedure(s) Performed: KNEE ARTHROSCOPY WITH POSTERIOR CRUCIATE LIGAMENT (PCL) RECONSTRUCTION (Right: Knee) POSTERIOR LATERAL CORNER RECONSTRUCTION (Right: Knee)  Patient Location: PACU  Anesthesia Type:General  Level of Consciousness: awake, alert  and oriented  Airway & Oxygen Therapy: Patient Spontanous Breathing and Patient connected to nasal cannula oxygen  Post-op Assessment: Report given to RN and Post -op Vital signs reviewed and stable  Post vital signs: Reviewed and stable  Last Vitals:  Vitals Value Taken Time  BP    Temp    Pulse 101 05/10/21 1345  Resp 13 05/10/21 1345  SpO2 100 % 05/10/21 1345  Vitals shown include unvalidated device data.  Last Pain:  Vitals:   05/10/21 0912  TempSrc: Oral  PainSc: 5       Patients Stated Pain Goal: 5 (05/10/21 0912)  Complications: No notable events documented.

## 2021-05-10 NOTE — Anesthesia Preprocedure Evaluation (Addendum)
Anesthesia Evaluation  Patient identified by MRN, date of birth, ID band Patient awake    Reviewed: Allergy & Precautions, NPO status , Patient's Chart, lab work & pertinent test results  Airway Mallampati: II  TM Distance: >3 FB Neck ROM: Full    Dental  (+) Teeth Intact, Dental Advisory Given   Pulmonary neg pulmonary ROS,    Pulmonary exam normal breath sounds clear to auscultation       Cardiovascular negative cardio ROS Normal cardiovascular exam Rhythm:Regular Rate:Normal     Neuro/Psych negative neurological ROS  negative psych ROS   GI/Hepatic negative GI ROS, Neg liver ROS,   Endo/Other  Hypothyroidism   Renal/GU negative Renal ROS     Musculoskeletal Right knee cartilage tear, instability    Abdominal   Peds  Hematology negative hematology ROS (+)   Anesthesia Other Findings Day of surgery medications reviewed with the patient.  Reproductive/Obstetrics                             Anesthesia Physical Anesthesia Plan  ASA: 2  Anesthesia Plan: General   Post-op Pain Management:  Regional for Post-op pain   Induction: Intravenous  PONV Risk Score and Plan: 2 and Midazolam, Dexamethasone and Ondansetron  Airway Management Planned: LMA  Additional Equipment:   Intra-op Plan:   Post-operative Plan: Extubation in OR  Informed Consent: I have reviewed the patients History and Physical, chart, labs and discussed the procedure including the risks, benefits and alternatives for the proposed anesthesia with the patient or authorized representative who has indicated his/her understanding and acceptance.     Dental advisory given  Plan Discussed with: CRNA  Anesthesia Plan Comments:         Anesthesia Quick Evaluation

## 2021-05-10 NOTE — Interval H&P Note (Signed)
All questions answered, patient wants to proceed with procedure. ? ?

## 2021-05-10 NOTE — Anesthesia Procedure Notes (Signed)
Anesthesia Regional Block: Adductor canal block   Pre-Anesthetic Checklist: , timeout performed,  Correct Patient, Correct Site, Correct Laterality,  Correct Procedure, Correct Position, site marked,  Risks and benefits discussed,  Surgical consent,  Pre-op evaluation,  At surgeon's request and post-op pain management  Laterality: Right  Prep: chloraprep       Needles:  Injection technique: Single-shot  Needle Type: Echogenic Needle     Needle Length: 9cm  Needle Gauge: 21     Additional Needles:   Procedures:,,,, ultrasound used (permanent image in chart),,    Narrative:  Start time: 05/10/2021 9:33 AM End time: 05/10/2021 9:39 AM Injection made incrementally with aspirations every 5 mL.  Performed by: Personally  Anesthesiologist: Cecile Hearing, MD  Additional Notes: No pain on injection. No increased resistance to injection. Injection made in 5cc increments.  Good needle visualization.  Patient tolerated procedure well.

## 2021-05-10 NOTE — Discharge Instructions (Addendum)
Ramond Marrow MD, MPH Alfonse Alpers, PA-C [facility] [address], [address] [phone] (tel)   [phone] (fax)   POST-OPERATIVE INSTRUCTIONS - KNEE  WOUND CARE - You may remove the Operative Dressing on Post-Op Day #3 (72hrs after surgery).   - Alternatively if you would like you can leave dressing on until follow-up if within 7-8 days but keep it dry. - Leave steri-strips in place until they fall off on their own, usually 2 weeks postop. - An ACE wrap may be used to control swelling, do not wrap this too tight.  If the initial ACE wrap feels too tight you may loosen it. - There may be a small amount of fluid/bleeding leaking at the surgical site.  - This is normal; the knee is filled with fluid during the procedure and can leak for 24-48hrs after surgery.  - You may change/reinforce the bandage as needed.  - Use the Cryocuff or Ice as often as possible for the first 7 days, then as needed for pain relief. Always keep a towel, ACE wrap or other barrier between the cooling unit and your skin.  - You may shower on Post-Op Day #3. Gently pat the area dry. Do not soak the knee in water or submerge it.  - Do not go swimming in the pool or ocean until 4 weeks after surgery or when otherwise instructed.  Keep incisions as dry as possible.   BRACE/AMBULATION - You will be placed in a brace post-operatively.  - Wear your brace at all times until follow-up.  - You may remove for hygiene. -           Use crutches or a walker to help you ambulate -           Do NOT put any body weight on your leg  - Due to the bulky dressing and ice machine, your Bledsoe hinged brace would not fit properly over your knee - This is why you are in a knee immobilizer - You may come in to the office tomorrow or Friday and we can adjust your hinged brace for you - Let us know if you have any questions    REGIONAL ANESTHESIA (NERVE BLOCKS) - The anesthesia team may have performed a  nerve block for you if safe in the setting of your care.  This is a great tool used to minimize pain.  Typically the block may start wearing off overnight.  This can be a challenging period but please utilize your as needed pain medications to try and manage this period and know it will be a brief transition as the nerve block wears completely   POST-OP MEDICATIONS - Multimodal approach to pain control - In general your pain will be controlled with a combination of substances.  Prescriptions unless otherwise discussed are electronically sent to your pharmacy.  This is a carefully made plan we use to minimize narcotic use.     - Celebrex - Anti-inflammatory medication taken on a scheduled basis - Acetaminophen - Non-narcotic pain medicine taken on a scheduled basis  - Gabapentin - this is a non-narcotic medication to help with pain, take on a scheduled basis - Robaxin - this is a muscle relaxer, take as needed for muscle spasms - Oxycodone - This is a strong narcotic, to be used only on an "as needed" basis for pain. - Aspirin 81mg  - This medicine is used to minimize the risk of blood clots after surgery. - Zofran -  take as needed for nausea  FOLLOW-UP   Please call the office to schedule a follow-up appointment for your incision check, 7-10 days post-operatively.  IF YOU HAVE ANY QUESTIONS, PLEASE FEEL FREE TO CALL OUR OFFICE.   HELPFUL INFORMATION  - If you had a block, it will wear off between 8-24 hrs postop typically.  This is period when your pain may go from nearly zero to the pain you would have had post-op without the block.  This is an abrupt transition but nothing dangerous is happening.  You may take an extra dose of narcotic when this happens.   Keep your leg elevated to decrease swelling, which will then in turn decrease your pain. I would elevate the foot of your bed by putting a couple of couch pillows between your mattress and box spring. I would not keep pillow directly  under your ankle.  - Do not sleep with a pillow behind your knee even if it is more comfortable as this may make it harder to get your knee fully straight long term.   There will be MORE swelling on days 1-3 than there is on the day of surgery.  This also is normal. The swelling will decrease with the anti-inflammatory medication, ice and keeping it elevated. The swelling will make it more difficult to bend your knee. As the swelling goes down your motion will become easier   You may develop swelling and bruising that extends from your knee down to your calf and perhaps even to your foot over the next week. Do not be alarmed. This too is normal, and it is due to gravity   There may be some numbness adjacent to the incision site. This may last for 6-12 months or longer in some patients and is expected.   You may return to sedentary work/school in the next couple of days when you feel up to it. You will need to keep your leg elevated as much as possible    You should wean off your narcotic medicines as soon as you are able.  Most patients will be off or using minimal narcotics before their first postop appointment.    We suggest you use the pain medication the first night prior to going to bed, in order to ease any pain when the anesthesia wears off. You should avoid taking pain medications on an empty stomach as it will make you nauseous.   Do not drink alcoholic beverages or take illicit drugs when taking pain medications.   It is against the law to drive while taking narcotics. You cannot drive if your Right leg is in brace locked in extension.   Pain medication may make you constipated.  Below are a few solutions to try in this order:  o Decrease the amount of pain medication if you aren't having pain.  o Drink lots of decaffeinated fluids.  o Drink prune juice and/or each dried prunes   o If the first 3 don't work start with additional solutions  o Take Colace - an  over-the-counter stool softener  o Take Senokot - an over-the-counter laxative  o Take Miralax - a stronger over-the-counter laxative   For more information including helpful videos and documents visit our website:   [email or web address]    May take Tylenol after 3pm, if needed.    Post Anesthesia Home Care Instructions  Activity: Get plenty of rest for the remainder of the day. A responsible individual must stay with you for 24 hours following  the procedure.  For the next 24 hours, DO NOT: -Drive a car -Advertising copywriter -Drink alcoholic beverages -Take any medication unless instructed by your physician -Make any legal decisions or sign important papers.  Meals: Start with liquid foods such as gelatin or soup. Progress to regular foods as tolerated. Avoid greasy, spicy, heavy foods. If nausea and/or vomiting occur, drink only clear liquids until the nausea and/or vomiting subsides. Call your physician if vomiting continues.  Special Instructions/Symptoms: Your throat may feel dry or sore from the anesthesia or the breathing tube placed in your throat during surgery. If this causes discomfort, gargle with warm salt water. The discomfort should disappear within 24 hours.  If you had a scopolamine patch placed behind your ear for the management of post- operative nausea and/or vomiting:  1. The medication in the patch is effective for 72 hours, after which it should be removed.  Wrap patch in a tissue and discard in the trash. Wash hands thoroughly with soap and water. 2. You may remove the patch earlier than 72 hours if you experience unpleasant side effects which may include dry mouth, dizziness or visual disturbances. 3. Avoid touching the patch. Wash your hands with soap and water after contact with the patch.    Regional Anesthesia Blocks  1. Numbness or the inability to move the "blocked" extremity may last from 3-48 hours after placement. The  length of time depends on the medication injected and your individual response to the medication. If the numbness is not going away after 48 hours, call your surgeon.  2. The extremity that is blocked will need to be protected until the numbness is gone and the  Strength has returned. Because you cannot feel it, you will need to take extra care to avoid injury. Because it may be weak, you may have difficulty moving it or using it. You may not know what position it is in without looking at it while the block is in effect.  3. For blocks in the legs and feet, returning to weight bearing and walking needs to be done carefully. You will need to wait until the numbness is entirely gone and the strength has returned. You should be able to move your leg and foot normally before you try and bear weight or walk. You will need someone to be with you when you first try to ensure you do not fall and possibly risk injury.  4. Bruising and tenderness at the needle site are common side effects and will resolve in a few days.  5. Persistent numbness or new problems with movement should be communicated to the surgeon or the [facility] [id number] [facility] [phone]).

## 2021-05-11 ENCOUNTER — Encounter (HOSPITAL_BASED_OUTPATIENT_CLINIC_OR_DEPARTMENT_OTHER): Payer: Self-pay | Admitting: Orthopaedic Surgery

## 2021-05-11 NOTE — Op Note (Signed)
Orthopaedic Surgery Operative Note (CSN: [id number])  [name]  1966/05/31 Date of Surgery: 05/10/2021   Diagnoses:  Right knee PCL and posterior lateral corner injury  Procedure: Right arthroscopic PCL reconstruction with allograft Right posterior lateral corner reconstruction Right neurolysis of the peroneal nerve   Operative Finding Exam under anesthesia: Full motion in flexion, patient had about a 5 degree flexion contracture however.  He had clear grade 2B instability in regards to his posterior drawer but his anterior drawer was relatively normal.  His posterior lateral corner had a tibial rotational abnormality but we were able to note a drive-through sign on the lateral side of the joint when we performed arthroscopy though there was no obvious varus valgus instability on her preoperative exam. Suprapatellar pouch: Normal Patellofemoral Compartment: Grade 2 changes on the trochlea and patella Medial Compartment: Normal Lateral Compartment: Drive-through sign otherwise normal Intercondylar Notch: Relatively normal-appearing though the ACL had minimal tension.  Successful completion of the planned procedure.  We were happy with our robust fixation but the patient is at high risk for stiffness secondary to his stiffness preoperatively as well as his significant traumatic injury.  Post-operative plan: The patient will be touchdown weightbearing per my standard multi ligamentous protocol.  The patient will be discharged home.  DVT prophylaxis Aspirin 81 mg twice daily for 6 weeks.  Pain control with PRN pain medication preferring oral medicines.  Follow up plan will be scheduled in approximately 7 days for incision check and XR.  Post-Op Diagnosis: Same Surgeons:Primary: Bjorn Pippin, MD Assistants:Caroline McBane PA-C Location: [facility] OR ROOM 6 Anesthesia: General with adductor canal Antibiotics: Ancef 2 g with local vancomycin powder 1 g at the surgical site Tourniquet time:   Total Tourniquet Time Documented: Thigh (Right) - 118 minutes Total: Thigh (Right) - 118 minutes  Estimated Blood Loss: Minimal Complications: None Specimens: None Implants: Implant Name Type Inv. Item Serial No. Manufacturer Lot No. LRB No. Used Action  GRAFT ACHILLES TENDON - [id number] Bone Implant GRAFT ACHILLES TENDON [id number] [facility] [id number] Right 1 Implanted  TISSUE GRAFTLINK 65-95MML - [id number] Tissue TISSUE GRAFTLINK 65-95MML [id number] [facility] [id number] Right 1 Implanted  PK GRAFTLINK ALLO IMPLANT SYST - [id number] Anchor PK GRAFTLINK ALLO IMPLANT SYST  [employer] [id number] Right 1 Implanted  SCREW INTERFERENCE 8X20MM - [id number] Screw SCREW INTERFERENCE 8X20MM  [employer] [id number] Right 1 Implanted  ANCHOR PEEK SWIVEL LOCK 5.5 - [id number] Anchor ANCHOR PEEK SWIVEL LOCK 5.5  [employer] [id number] Right 1 Implanted  SCREW FT BIOCOMP 9X30 - [id number] Screw SCREW FT BIOCOMP 9X30  [employer] [id number] Right 1 Implanted  Linton Flemings TIGHTROPE RN [id number] Anchor ANCHOR BUTTON TIGHTROPE RN 14  [place] [id number] Right 1 Implanted    Indications for Surgery:   [name] is a 55 y.o. male with multitrauma injury resulting in a multi ligamentous knee injury that I was referred to me for 3 months after.  Benefits and risks of operative and nonoperative management were discussed prior to surgery with patient/guardian(s) and informed consent form was completed.  Specific risks including infection, need for additional surgery, stiffness, ligamentous laxity, rerupture, peroneal nerve injury amongst other neurovascular injury.   Procedure:   The patient was identified properly. Informed consent was obtained and the surgical site was marked. The patient was taken up to suite where general anesthesia was induced. The patient was placed in the supine position with a post against the surgical leg and a nonsterile tourniquet applied. The surgical leg  was then prepped and draped usual sterile fashion.  A standard surgical timeout was performed.  2 standard anterior portals were made and diagnostic arthroscopy performed. Please note the findings as noted above.  Once we identified that the PCL and posterior lateral corner were indeed in need of reconstruction we had Damon Alpers PA-C start preparing grafts on the back table.  We used an Achilles it was fashioned into a bone plug and 2 soft tissue limbs as well as a graft link type graft from [employer] for the PCL.  PCL Reconstruction: At this point we began to identify the stump off the femur of the PCL.  This was grossly loose.  We began by sharply excising stump that was residual of the PCL taking care to not injure the ACL.  This point we were able to see to the back of the knee by placing the scope medial to the ACL fibers.  We then under direct visualization using spinal needle to localize and carefully place a posterior medial portal avoiding the posterior neurovascular structures.  We opened the capsule bluntly before placing a hard body cannula.  This point we checked our excursion and make sure that we were able to reach posterior lateral and distal on the tibia.  We then proceeded to use a 70 degree scope in combination with shaver and a radiofrequency ablator to clear the posterior stump of the PCL from its origin on the tibia.  At this point we checked with fluoroscopy to ensure that we were low enough just above the posterior flare of the tibia.    We then marked and cleared the central portion of the tibial insertion on the femur identifying a point just off the articular margin with enough room to allow a 10 mm flip cutter to avoid damage to the cartilage.  At this point we used the Arthrex PCL guide set with a flip cutter to first place a 2.4 mm guide pin, check her position on fluoroscopy and switch this for a 10 mm flip cutter.  We obtained good visualization of the tip of the  instrument and sure that it never passed into the capsule posteriorly.  The flip cutter was deployed and was reamed through the anteromedial tibia in typical fashion.  We then used a retro-reamer set guide to identify the femoral footprint of the PCL that we had marked and reamed a 25 mm tunnel into the femur again with a flip cutter taking care to avoid reaming out the medial cortex of the femur.  The graft was an allograft graft link Arthrex graft size 10x95 with buttons attached to both ends.  This point passing sutures were placed through both of these tunnels after bony debris was cleared and returned our attention to the remainder of the case.  We then turned our attention to the lateral aspect of the dissection and made a incisions halfway between the fibular head and Gertie's tubercle and a standard lateral approach overlying the IT band proximally.  We able to identify the IT band and dissected distally to its identifying the fibular head.  At this point we took great care to identify a neurolysed the peroneal nerve protecting it with blunt metal retractors throughout the case.  The nerve was freed and was obviously loose of tension throughout the case.  This point we made the 3 windows of [place] one overlying the lateral epicondyle 1 over Gertie's tubercle and one overlying the fibular head.  We dissected off soft tissues  and were able to dissect the posterior lateral aspect of the knee carefully placing a blunt retractor to protect the posterior neurovascular structures.  We had already cleared the sites of each of our bone tunnels and then proceeded to shape a Achilles allograft with a 11 mm bone plug and 2 limbs each tubularized to 7 mm.  This point we made a blind ended 20 mm deep tunnel in the femur 3 mm proximal and 2 mm posterior to the epicondyle localizing this area by identifying the proximal attachment of the fibular collateral ligament and drilling at that point while preserving  native tissue.  We then were able to Place our bone plug into the blind tunnel using a bone tamp and securing it with a 8 mm x 20 mm metal screw.  We then drilled an anterior to posterior tunnel through the fibular head taking care to protect the posterior neurovascular structures and the peroneal nerve as we progressed and a anterior to posterior tunnel through the tibia centered anteriorly over Gertie's tubercle aiming to a point posteriorly 1 cm distal and 1 cm medial to the joint line and the tibiofibular joint respectively.  Both of these were done over a guidewire and a metal retractor was used to protect the posterior neurovascular structures throughout.  Shuttling sutures were passed through these tunnels and a limb of our Achilles graft was passed anterior to posterior through the fibular tunnel and then both limbs were then passed from posterior to anterior through the tibia.  Each of these limbs were sequentially tightened and secured with a bioabsorbable screw.   Native tissue was imbricated into the graft to augment the repair with interrupted sutures.  Peroneal nerve was intact at completion of case.  We turned our attention back to the articular scope.  We used our passing sutures to first dilate our anteromedial portal and shuttle our graft into the tibial tunnel before putting into the femoral tunnel.  We then were able to check to make sure the button was directly on bone and tighten the femoral tunnel before placing her button and tighten the tibial tunnel.  We had good fixation of both ends.  With recreation of stability in the knee with good varus valgus stability as well as anterior posterior.  He did have some slight sagging of the tibia but we felt that his knee was much more stable and only started.  Incisions closed with absorbable suture. The patient was awoken from general anesthesia and taken to the PACU in stable condition without complication.   Damon Alpers, PA-C, present  and scrubbed throughout the case, critical for completion in a timely fashion, and for retraction, instrumentation, closure.

## 2021-05-15 ENCOUNTER — Other Ambulatory Visit: Payer: Self-pay

## 2021-05-15 ENCOUNTER — Other Ambulatory Visit: Payer: Self-pay | Admitting: Nurse Practitioner

## 2021-05-15 NOTE — Telephone Encounter (Signed)
Requested medication (s) are due for refill today- no  Requested medication (s) are on the active medication list -yes  Future visit scheduled -no  Last refill: 05/10/21 #30  Notes to clinic: Request RF: non delegated Rx, outside provider  Requested Prescriptions  Pending Prescriptions Disp Refills   methocarbamol (ROBAXIN) 500 MG tablet 30 tablet 0    Sig: Take 1 tablet (500 mg total) by mouth every 8 (eight) hours as needed for muscle spasms.     Not Delegated - Analgesics:  Muscle Relaxants Failed - 05/15/2021  3:26 PM      Failed - This refill cannot be delegated      Passed - Valid encounter within last 6 months    Recent Outpatient Visits           1 month ago Encounter to establish care   [facility] [place], Shea Stakes, NP       Future Appointments             In 1 month Claiborne Rigg, NP [facility]               Requested Prescriptions  Pending Prescriptions Disp Refills   methocarbamol (ROBAXIN) 500 MG tablet 30 tablet 0    Sig: Take 1 tablet (500 mg total) by mouth every 8 (eight) hours as needed for muscle spasms.     Not Delegated - Analgesics:  Muscle Relaxants Failed - 05/15/2021  3:26 PM      Failed - This refill cannot be delegated      Passed - Valid encounter within last 6 months    Recent Outpatient Visits           1 month ago Encounter to establish care   [facility] Claiborne Rigg, NP       Future Appointments             In 1 month Claiborne Rigg, NP [facility]

## 2021-05-19 ENCOUNTER — Other Ambulatory Visit: Payer: Self-pay

## 2021-05-19 ENCOUNTER — Encounter: Payer: Self-pay | Admitting: Physical Medicine and Rehabilitation

## 2021-05-19 NOTE — Progress Notes (Unsigned)
Subjective:    Patient ID: [name], male    DOB: 1966-09-19, 55 y.o.   MRN: [id number]  HPI   Pain Inventory4 Average Pain  Pain Right Now 3 My pain is intermittent, constant, burning, and dull  In the last 24 hours, has pain interfered with the following? General activity 4 Relation with others 5 Enjoyment of life 7 What TIME of day is your pain at its worst? daytime and night Sleep (in general) Fair  Pain is worse with: walking, sitting, inactivity, and some activites Pain improves with: rest, heat/ice, therapy/exercise, and medication Relief from Meds: 7  No family history on file. Social History   Socioeconomic History  . Marital status: Married    Spouse name: Not on file  . Number of children: Not on file  . Years of education: Not on file  . Highest education level: Not on file  Occupational History  . Not on file  Tobacco Use  . Smoking status: Never  . Smokeless tobacco: Never  Vaping Use  . Vaping Use: Never used  Substance and Sexual Activity  . Alcohol use: Not Currently    Comment: social  . Drug use: Never  . Sexual activity: Not Currently  Other Topics Concern  . Not on file  Social History Narrative   ** Merged History Encounter **       Social Determinants of Health   Financial Resource Strain: Not on file  Food Insecurity: Not on file  Transportation Needs: Not on file  Physical Activity: Not on file  Stress: Not on file  Social Connections: Not on file   Past Surgical History:  Procedure Laterality Date  . FACIAL LACERATION REPAIR N/A 02/09/2021   Procedure: CLOSURE OF FACIAL LACERATION;  Surgeon: Rejeana Brock, MD;  Location: [facility] OR;  Service: ENT;  Laterality: N/A;  . KNEE ARTHROSCOPY WITH POSTERIOR CRUCIATE LIGAMENT (PCL) RECONSTRUCTION Right 05/10/2021   Procedure: KNEE ARTHROSCOPY WITH POSTERIOR CRUCIATE LIGAMENT (PCL) RECONSTRUCTION;  Surgeon: Bjorn Pippin, MD;  Location: [facility];  Service:  Orthopedics;  Laterality: Right;  . ORIF ACETABULAR FRACTURE Right 02/10/2021   Procedure: OPEN REDUCTION INTERNAL FIXATION (ORIF) ACETABULAR FRACTURE;  Surgeon: Roby Lofts, MD;  Location: [facility] OR;  Service: Orthopedics;  Laterality: Right;  . ORIF HUMERUS FRACTURE Left 02/10/2021   Procedure: OPEN REDUCTION INTERNAL FIXATION (ORIF) HUMERAL SHAFT FRACTURE;  Surgeon: Roby Lofts, MD;  Location: [facility] OR;  Service: Orthopedics;  Laterality: Left;  . THYROID SURGERY     radiation   Past Surgical History:  Procedure Laterality Date  . FACIAL LACERATION REPAIR N/A 02/09/2021   Procedure: CLOSURE OF FACIAL LACERATION;  Surgeon: Rejeana Brock, MD;  Location: [facility] OR;  Service: ENT;  Laterality: N/A;  . KNEE ARTHROSCOPY WITH POSTERIOR CRUCIATE LIGAMENT (PCL) RECONSTRUCTION Right 05/10/2021   Procedure: KNEE ARTHROSCOPY WITH POSTERIOR CRUCIATE LIGAMENT (PCL) RECONSTRUCTION;  Surgeon: Bjorn Pippin, MD;  Location: [facility];  Service: Orthopedics;  Laterality: Right;  . ORIF ACETABULAR FRACTURE Right 02/10/2021   Procedure: OPEN REDUCTION INTERNAL FIXATION (ORIF) ACETABULAR FRACTURE;  Surgeon: Roby Lofts, MD;  Location: [facility];  Service: Orthopedics;  Laterality: Right;  . ORIF HUMERUS FRACTURE Left 02/10/2021   Procedure: OPEN REDUCTION INTERNAL FIXATION (ORIF) HUMERAL SHAFT FRACTURE;  Surgeon: Roby Lofts, MD;  Location: [facility] OR;  Service: Orthopedics;  Laterality: Left;  . THYROID SURGERY     radiation   Past Medical History:  Diagnosis Date  .  Thyroid disease    hyper    BP (!) 150/88   Pulse 82   Temp 98.4 F (36.9 C)   Ht 5\' 11"  (1.803 m)   Wt 204 lb (92.5 kg)   SpO2 99%   BMI 28.45 kg/m   Opioid Risk Score:   Fall Risk Score:  `1  Depression screen PHQ 2/9  Depression screen PHQ 2/9 03/13/2021  Decreased Interest 0  Down, Depressed, Hopeless 0  PHQ - 2 Score 0  Altered sleeping 0  Tired, decreased energy 1  Change in appetite 0  Feeling bad or  failure about yourself  1  Trouble concentrating 0  Moving slowly or fidgety/restless 0  Suicidal thoughts 0  PHQ-9 Score 2  Difficult doing work/chores Not difficult at all    Review of Systems  Musculoskeletal:  Positive for gait problem.       RIGHT KNEE  All other systems reviewed and are negative.     Objective:   Physical Exam        Assessment & Plan:

## 2021-05-30 ENCOUNTER — Other Ambulatory Visit: Payer: Self-pay

## 2021-05-30 ENCOUNTER — Ambulatory Visit: Payer: Self-pay | Attending: Orthopaedic Surgery

## 2021-05-30 DIAGNOSIS — R2689 Other abnormalities of gait and mobility: Secondary | ICD-10-CM | POA: Insufficient documentation

## 2021-05-30 DIAGNOSIS — M25661 Stiffness of right knee, not elsewhere classified: Secondary | ICD-10-CM | POA: Insufficient documentation

## 2021-05-30 DIAGNOSIS — G8929 Other chronic pain: Secondary | ICD-10-CM | POA: Insufficient documentation

## 2021-05-30 DIAGNOSIS — Z9889 Other specified postprocedural states: Secondary | ICD-10-CM | POA: Insufficient documentation

## 2021-05-30 DIAGNOSIS — M25561 Pain in right knee: Secondary | ICD-10-CM | POA: Insufficient documentation

## 2021-05-30 DIAGNOSIS — M6281 Muscle weakness (generalized): Secondary | ICD-10-CM | POA: Insufficient documentation

## 2021-05-30 NOTE — Patient Instructions (Addendum)
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
## 2021-06-09 ENCOUNTER — Ambulatory Visit: Payer: Self-pay

## 2021-06-09 ENCOUNTER — Other Ambulatory Visit: Payer: Self-pay

## 2021-06-09 DIAGNOSIS — M25661 Stiffness of right knee, not elsewhere classified: Secondary | ICD-10-CM

## 2021-06-09 DIAGNOSIS — G8929 Other chronic pain: Secondary | ICD-10-CM

## 2021-06-09 DIAGNOSIS — Z9889 Other specified postprocedural states: Secondary | ICD-10-CM

## 2021-06-09 DIAGNOSIS — M6281 Muscle weakness (generalized): Secondary | ICD-10-CM

## 2021-06-09 DIAGNOSIS — R2689 Other abnormalities of gait and mobility: Secondary | ICD-10-CM
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
## 2021-06-14 ENCOUNTER — Other Ambulatory Visit: Payer: Self-pay

## 2021-06-14 ENCOUNTER — Ambulatory Visit: Payer: Self-pay

## 2021-06-14 DIAGNOSIS — R2689 Other abnormalities of gait and mobility: Secondary | ICD-10-CM

## 2021-06-14 DIAGNOSIS — M25661 Stiffness of right knee, not elsewhere classified: Secondary | ICD-10-CM

## 2021-06-14 DIAGNOSIS — M6281 Muscle weakness (generalized): Secondary | ICD-10-CM

## 2021-06-14 DIAGNOSIS — G8929 Other chronic pain: Secondary | ICD-10-CM

## 2021-06-14 DIAGNOSIS — Z9889 Other specified postprocedural states: Secondary | ICD-10-CM
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
## 2021-06-16 ENCOUNTER — Other Ambulatory Visit: Payer: Self-pay

## 2021-06-21 ENCOUNTER — Encounter: Payer: Self-pay | Admitting: Physical Therapy

## 2021-06-21 ENCOUNTER — Other Ambulatory Visit: Payer: Self-pay

## 2021-06-21 ENCOUNTER — Ambulatory Visit: Payer: Self-pay

## 2021-06-21 ENCOUNTER — Ambulatory Visit: Payer: Self-pay | Admitting: Physical Therapy

## 2021-06-21 DIAGNOSIS — M6281 Muscle weakness (generalized): Secondary | ICD-10-CM

## 2021-06-21 DIAGNOSIS — M25661 Stiffness of right knee, not elsewhere classified: Secondary | ICD-10-CM

## 2021-06-21 DIAGNOSIS — M25561 Pain in right knee: Secondary | ICD-10-CM

## 2021-06-21 DIAGNOSIS — G8929 Other chronic pain: Secondary | ICD-10-CM

## 2021-06-21 DIAGNOSIS — R2689 Other abnormalities of gait and mobility: Secondary | ICD-10-CM
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
## 2021-06-28 ENCOUNTER — Ambulatory Visit: Payer: Self-pay | Attending: Orthopaedic Surgery

## 2021-06-28 ENCOUNTER — Other Ambulatory Visit: Payer: Self-pay

## 2021-06-28 DIAGNOSIS — M25661 Stiffness of right knee, not elsewhere classified: Secondary | ICD-10-CM | POA: Insufficient documentation

## 2021-06-28 DIAGNOSIS — M6281 Muscle weakness (generalized): Secondary | ICD-10-CM | POA: Insufficient documentation

## 2021-06-28 DIAGNOSIS — G8929 Other chronic pain: Secondary | ICD-10-CM | POA: Insufficient documentation

## 2021-06-28 DIAGNOSIS — Z9889 Other specified postprocedural states: Secondary | ICD-10-CM | POA: Insufficient documentation

## 2021-06-28 DIAGNOSIS — R262 Difficulty in walking, not elsewhere classified: Secondary | ICD-10-CM | POA: Insufficient documentation

## 2021-06-28 DIAGNOSIS — M25561 Pain in right knee: Secondary | ICD-10-CM | POA: Insufficient documentation

## 2021-06-28 DIAGNOSIS — R2689 Other abnormalities of gait and mobility: Secondary | ICD-10-CM | POA: Insufficient documentation
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
## 2021-07-04 ENCOUNTER — Ambulatory Visit: Payer: Self-pay

## 2021-07-05 ENCOUNTER — Ambulatory Visit: Payer: Self-pay | Admitting: Nurse Practitioner

## 2021-07-12 ENCOUNTER — Ambulatory Visit: Payer: Self-pay

## 2021-07-12 ENCOUNTER — Other Ambulatory Visit: Payer: Self-pay

## 2021-07-12 DIAGNOSIS — R262 Difficulty in walking, not elsewhere classified: Secondary | ICD-10-CM

## 2021-07-12 DIAGNOSIS — R2689 Other abnormalities of gait and mobility: Secondary | ICD-10-CM

## 2021-07-12 DIAGNOSIS — M6281 Muscle weakness (generalized): Secondary | ICD-10-CM

## 2021-07-12 DIAGNOSIS — M25661 Stiffness of right knee, not elsewhere classified: Secondary | ICD-10-CM

## 2021-07-12 DIAGNOSIS — G8929 Other chronic pain: Secondary | ICD-10-CM

## 2021-07-12 DIAGNOSIS — Z9889 Other specified postprocedural states: Secondary | ICD-10-CM
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
## 2021-07-17 ENCOUNTER — Other Ambulatory Visit: Payer: Self-pay | Admitting: Pharmacist

## 2021-07-17 ENCOUNTER — Encounter: Payer: Self-pay | Attending: Physical Medicine and Rehabilitation | Admitting: Physical Medicine and Rehabilitation

## 2021-07-17 ENCOUNTER — Other Ambulatory Visit: Payer: Self-pay

## 2021-07-17 ENCOUNTER — Other Ambulatory Visit: Payer: Self-pay | Admitting: Nurse Practitioner

## 2021-07-17 DIAGNOSIS — E890 Postprocedural hypothyroidism: Secondary | ICD-10-CM

## 2021-07-17 MED ORDER — LEVOTHYROXINE SODIUM 125 MCG PO TABS
ORAL_TABLET | ORAL | 0 refills | Status: DC
  Filled 2021-07-17: qty 30, 30d supply, fill #0

## 2021-07-18 ENCOUNTER — Other Ambulatory Visit: Payer: Self-pay

## 2021-07-19 ENCOUNTER — Ambulatory Visit: Payer: Self-pay

## 2021-07-19 ENCOUNTER — Other Ambulatory Visit: Payer: Self-pay

## 2021-07-19 DIAGNOSIS — M6281 Muscle weakness (generalized): Secondary | ICD-10-CM

## 2021-07-19 DIAGNOSIS — M25561 Pain in right knee: Secondary | ICD-10-CM

## 2021-07-19 DIAGNOSIS — M25661 Stiffness of right knee, not elsewhere classified: Secondary | ICD-10-CM

## 2021-07-19 DIAGNOSIS — R262 Difficulty in walking, not elsewhere classified: Secondary | ICD-10-CM

## 2021-07-19 DIAGNOSIS — R2689 Other abnormalities of gait and mobility: Secondary | ICD-10-CM

## 2021-07-19 DIAGNOSIS — Z9889 Other specified postprocedural states: Secondary | ICD-10-CM
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
## 2021-07-26 ENCOUNTER — Ambulatory Visit: Payer: Self-pay | Attending: Orthopaedic Surgery

## 2021-07-26 ENCOUNTER — Other Ambulatory Visit: Payer: Self-pay

## 2021-07-26 DIAGNOSIS — M6281 Muscle weakness (generalized): Secondary | ICD-10-CM | POA: Insufficient documentation

## 2021-07-26 DIAGNOSIS — Z9889 Other specified postprocedural states: Secondary | ICD-10-CM | POA: Insufficient documentation

## 2021-07-26 DIAGNOSIS — G8929 Other chronic pain: Secondary | ICD-10-CM | POA: Insufficient documentation

## 2021-07-26 DIAGNOSIS — R2689 Other abnormalities of gait and mobility: Secondary | ICD-10-CM | POA: Insufficient documentation

## 2021-07-26 DIAGNOSIS — M25561 Pain in right knee: Secondary | ICD-10-CM | POA: Insufficient documentation

## 2021-07-26 DIAGNOSIS — R262 Difficulty in walking, not elsewhere classified: Secondary | ICD-10-CM | POA: Insufficient documentation

## 2021-07-26 DIAGNOSIS — M25661 Stiffness of right knee, not elsewhere classified: Secondary | ICD-10-CM | POA: Insufficient documentation
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
## 2021-08-15 ENCOUNTER — Other Ambulatory Visit: Payer: Self-pay | Admitting: Family Medicine

## 2021-08-15 ENCOUNTER — Other Ambulatory Visit: Payer: Self-pay

## 2021-08-15 ENCOUNTER — Other Ambulatory Visit: Payer: Self-pay | Admitting: Nurse Practitioner

## 2021-08-15 ENCOUNTER — Other Ambulatory Visit: Payer: Self-pay | Admitting: Pharmacist

## 2021-08-15 MED ORDER — LEVOTHYROXINE SODIUM 125 MCG PO TABS
ORAL_TABLET | ORAL | 0 refills | Status: DC
  Filled 2021-08-15: qty 7, 7d supply, fill #0

## 2021-08-15 NOTE — Telephone Encounter (Signed)
Requested medication (s) are due for refill today: future visit in 1 week  Requested medication (s) are on the active medication list: yes  Last refill:  08/15/21 #7 0 refills  Future visit scheduled: yes in 1 week  Notes to clinic:  TSH was due 07/06/21 do you want to refill Rx? No receipt confirmed by pharmacy      Requested Prescriptions  Pending Prescriptions Disp Refills   levothyroxine (SYNTHROID) 125 MCG tablet [Pharmacy Med Name: Levothyroxine Sodium 125 MCG Oral Tablet] 90 tablet 0    Sig: TAKE 1 TABLET BY MOUTH ONCE DAILY IN THE MORNING     Endocrinology:  Hypothyroid Agents Failed - 08/15/2021  9:16 AM      Failed - TSH needs to be rechecked within 3 months after an abnormal result. Refill until TSH is due.      Failed - TSH in normal range and within 360 days    TSH  Date Value Ref Range Status  04/05/2021 5.650 (H) 0.450 - 4.500 uIU/mL Final          Passed - Valid encounter within last 12 months    Recent Outpatient Visits           4 months ago Encounter to establish care   [facility] Claiborne Rigg, NP       Future Appointments             In 1 week Claiborne Rigg, NP [facility]

## 2021-08-23 ENCOUNTER — Other Ambulatory Visit (HOSPITAL_COMMUNITY)
Admission: RE | Admit: 2021-08-23 | Discharge: 2021-08-23 | Disposition: A | Discharge status: Home / Self Care | Payer: Self-pay | Source: Ambulatory Visit | Attending: Nurse Practitioner | Admitting: Nurse Practitioner

## 2021-08-23 ENCOUNTER — Other Ambulatory Visit: Payer: Self-pay

## 2021-08-23 ENCOUNTER — Ambulatory Visit: Payer: Self-pay | Attending: Nurse Practitioner | Admitting: Nurse Practitioner

## 2021-08-23 ENCOUNTER — Encounter: Payer: Self-pay | Admitting: Nurse Practitioner

## 2021-08-23 ENCOUNTER — Other Ambulatory Visit (HOSPITAL_COMMUNITY): Payer: Self-pay

## 2021-08-23 VITALS — BP 132/80 | HR 71 | Ht 71.0 in | Wt 212.1 lb

## 2021-08-23 DIAGNOSIS — Z1211 Encounter for screening for malignant neoplasm of colon: Secondary | ICD-10-CM

## 2021-08-23 DIAGNOSIS — R972 Elevated prostate specific antigen [PSA]: Secondary | ICD-10-CM | POA: Insufficient documentation

## 2021-08-23 DIAGNOSIS — D696 Thrombocytopenia, unspecified: Secondary | ICD-10-CM

## 2021-08-23 DIAGNOSIS — Z114 Encounter for screening for human immunodeficiency virus [HIV]: Secondary | ICD-10-CM

## 2021-08-23 DIAGNOSIS — E890 Postprocedural hypothyroidism: Secondary | ICD-10-CM

## 2021-08-23 MED ORDER — LEVOTHYROXINE SODIUM 125 MCG PO TABS
125.0000 ug | ORAL_TABLET | Freq: Every day | ORAL | 1 refills | Status: DC
  Filled 2021-08-23 – 2021-11-27 (×3): qty 90, 90d supply, fill #0

## 2021-08-23 NOTE — Progress Notes (Signed)
Assessment & Plan:  [name] was seen today for thyroid problem.  Diagnoses and all orders for this visit:  Postoperative hypothyroidism -     levothyroxine (SYNTHROID) 125 MCG tablet; Take 1 tablet (125 mcg total) by mouth daily before breakfast on an empty stomach. -     Thyroid Panel With TSH  Elevated PSA -     PSA -     Urine cytology ancillary only  Colon cancer screening -     Fecal occult blood, imunochemical([facility])  Thrombocytopenia (HCC) -     CMP14+EGFR  Encounter for screening for HIV -     HIV antibody (with reflex)   Patient has been counseled on age-appropriate routine health concerns for screening and prevention. These are reviewed and up-to-date. Referrals have been placed accordingly. Immunizations are up-to-date or declined.    Subjective:   Chief Complaint  Patient presents with   Thyroid Problem   HPI [name] 55 y.o. male presents to office today for follow up to hypothyroidism. He has a past medical history of Thyroid disease.    Hypothyroidism Currently taking synthroid 125 mg daily as prescribed. Denies any symptoms of hypo or hyperthyroidism.  Lab Results  Component Value Date   TSH 5.650 (H) 04/05/2021       Review of Systems  Constitutional:  Negative for fever, malaise/fatigue and weight loss.  HENT: Negative.  Negative for nosebleeds.   Eyes: Negative.  Negative for blurred vision, double vision and photophobia.  Respiratory: Negative.  Negative for cough and shortness of breath.   Cardiovascular: Negative.  Negative for chest pain, palpitations and leg swelling.  Gastrointestinal: Negative.  Negative for heartburn, nausea and vomiting.  Musculoskeletal: Negative.  Negative for myalgias.  Neurological: Negative.  Negative for dizziness, focal weakness, seizures and headaches.  Psychiatric/Behavioral: Negative.  Negative for suicidal ideas.    Past Medical History:  Diagnosis Date   Thyroid disease    hyper      Past Surgical History:  Procedure Laterality Date   FACIAL LACERATION REPAIR N/A 02/09/2021   Procedure: CLOSURE OF FACIAL LACERATION;  Surgeon: Marcina Millard, MD;  Location: [place];  Service: ENT;  Laterality: N/A;   KNEE ARTHROSCOPY WITH POSTERIOR CRUCIATE LIGAMENT (PCL) RECONSTRUCTION Right 05/10/2021   Procedure: KNEE ARTHROSCOPY WITH POSTERIOR CRUCIATE LIGAMENT (PCL) RECONSTRUCTION;  Surgeon: Hiram Gash, MD;  Location: [place];  Service: Orthopedics;  Laterality: Right;   ORIF ACETABULAR FRACTURE Right 02/10/2021   Procedure: OPEN REDUCTION INTERNAL FIXATION (ORIF) ACETABULAR FRACTURE;  Surgeon: Shona Needles, MD;  Location: [place];  Service: Orthopedics;  Laterality: Right;   ORIF HUMERUS FRACTURE Left 02/10/2021   Procedure: OPEN REDUCTION INTERNAL FIXATION (ORIF) HUMERAL SHAFT FRACTURE;  Surgeon: Shona Needles, MD;  Location: [place];  Service: Orthopedics;  Laterality: Left;   THYROID SURGERY     radiation    History reviewed. No pertinent family history.  Social History Reviewed with no changes to be made today.   Outpatient Medications Prior to Visit  Medication Sig Dispense Refill   levothyroxine (SYNTHROID) 125 MCG tablet Take 1 tablet  by mouth in the morning on an empty stomach.  Must keep upcoming office visit for refills 7 tablet 0   Multiple Vitamin (MULTIVITAMIN WITH MINERALS) TABS tablet Take 1 tablet by mouth daily. (Patient not taking: Reported on 08/23/2021)     docusate sodium (COLACE) 100 MG capsule Take 1 capsule (100 mg total) by mouth 2 (two) times daily. (Patient not  taking: Reported on 08/23/2021) 10 capsule 0   methocarbamol (ROBAXIN) 500 MG tablet Take 1 tablet (500 mg total) by mouth every 8 (eight) hours as needed for muscle spasms. (Patient not taking: Reported on 08/23/2021) 30 tablet 0   oxycodone (OXY-IR) 5 MG capsule Take 5 mg by mouth every 4 (four) hours as needed.     polyethylene glycol (MIRALAX / GLYCOLAX) 17 g packet  Take 17 g by mouth 2 (two) times daily. (Patient not taking: Reported on 08/23/2021) 14 each 0   No facility-administered medications prior to visit.    Allergies  Allergen Reactions   Lactose Intolerance (Gi)    Pork-Derived Products        Objective:    BP 132/80   Pulse 71   Ht 5' 11"  (1.803 m)   Wt 212 lb 2 oz (96.2 kg)   SpO2 99%   BMI 29.59 kg/m  Wt Readings from Last 3 Encounters:  08/23/21 212 lb 2 oz (96.2 kg)  05/19/21 204 lb (92.5 kg)  05/10/21 203 lb 14.8 oz (92.5 kg)    Physical Exam Vitals and nursing note reviewed.  Constitutional:      Appearance: He is well-developed.  HENT:     Head: Normocephalic and atraumatic.  Cardiovascular:     Rate and Rhythm: Normal rate and regular rhythm.     Heart sounds: Normal heart sounds. No murmur heard.   No friction rub. No gallop.  Pulmonary:     Effort: Pulmonary effort is normal. No tachypnea or respiratory distress.     Breath sounds: Normal breath sounds. No decreased breath sounds, wheezing, rhonchi or rales.  Chest:     Chest wall: No tenderness.  Abdominal:     General: Bowel sounds are normal.     Palpations: Abdomen is soft.  Musculoskeletal:        General: Normal range of motion.     Cervical back: Normal range of motion.  Skin:    General: Skin is warm and dry.  Neurological:     Mental Status: He is alert and oriented to person, place, and time.     Coordination: Coordination normal.  Psychiatric:        Behavior: Behavior normal. Behavior is cooperative.        Thought Content: Thought content normal.        Judgment: Judgment normal.         Patient has been counseled extensively about nutrition and exercise as well as the importance of adherence with medications and regular follow-up. The patient was given clear instructions to go to ER or return to medical center if symptoms don't improve, worsen or new problems develop. The patient verbalized understanding.   Follow-up: Return in  about 6 months (around 02/20/2022).   Gildardo Pounds, FNP-BC [facility] [place], [place]   08/23/2021, 4:50 PM

## 2021-08-24 LAB — CMP14+EGFR
ALT: 13 IU/L (ref 0–44)
AST: 15 IU/L (ref 0–40)
Albumin/Globulin Ratio: 1.7 (ref 1.2–2.2)
Albumin: 4.4 g/dL (ref 3.8–4.9)
Alkaline Phosphatase: 94 IU/L (ref 44–121)
BUN/Creatinine Ratio: 12 (ref 9–20)
BUN: 11 mg/dL (ref 6–24)
Bilirubin Total: 0.3 mg/dL (ref 0.0–1.2)
CO2: 23 mmol/L (ref 20–29)
Calcium: 9.9 mg/dL (ref 8.7–10.2)
Chloride: 104 mmol/L (ref 96–106)
Creatinine, Ser: 0.93 mg/dL (ref 0.76–1.27)
Globulin, Total: 2.6 g/dL (ref 1.5–4.5)
Glucose: 101 mg/dL — ABNORMAL HIGH (ref 70–99)
Potassium: 4.1 mmol/L (ref 3.5–5.2)
Sodium: 144 mmol/L (ref 134–144)
Total Protein: 7 g/dL (ref 6.0–8.5)
eGFR: 96 mL/min/{1.73_m2} (ref >59)

## 2021-08-24 LAB — PSA: Prostate Specific Ag, Serum: 1.9 ng/mL (ref 0.0–4.0)

## 2021-08-24 LAB — THYROID PANEL WITH TSH
Free Thyroxine Index: 2.4 (ref 1.2–4.9)
T3 Uptake Ratio: 29 % (ref 24–39)
T4, Total: 8.3 ug/dL (ref 4.5–12.0)
TSH: 2.68 u[IU]/mL (ref 0.450–4.500)

## 2021-08-24 LAB — URINE CYTOLOGY ANCILLARY ONLY
Bacterial Vaginitis-Urine: NEGATIVE
Candida Urine: NEGATIVE
Chlamydia: NEGATIVE
Comment: NEGATIVE
Comment: NEGATIVE
Comment: NORMAL
Neisseria Gonorrhea: NEGATIVE
Trichomonas: NEGATIVE

## 2021-08-24 LAB — HIV ANTIBODY (ROUTINE TESTING W REFLEX): HIV Screen 4th Generation wRfx: NONREACTIVE

## 2021-08-25 ENCOUNTER — Telehealth: Payer: Self-pay

## 2021-08-25 LAB — FECAL OCCULT BLOOD, IMMUNOCHEMICAL: Fecal Occult Bld: NEGATIVE

## 2021-08-25 NOTE — Telephone Encounter (Signed)
Left message to return call to our office.  

## 2021-08-25 NOTE — Telephone Encounter (Signed)
-----   Message from Claiborne Rigg, NP sent at 08/24/2021  6:32 PM EST ----- HIV negative Kidney, liver function and electrolytes are normal.   Prostate level is normal  Thyroid level normal Urine negative for gonorrhea, chlamydia, trichomonas

## 2021-10-18 ENCOUNTER — Telehealth: Payer: Self-pay

## 2021-10-18 NOTE — Telephone Encounter (Signed)
Pt given results from 08/23/21 per notes of Zelda, NP on 10/16/21. Pt verbalized understanding.

## 2021-10-24 ENCOUNTER — Telehealth: Payer: Self-pay

## 2021-10-24 NOTE — Telephone Encounter (Signed)
Copied from [place] [id number]. Topic: General - Other >> Oct 18, 2021  3:17 PM Yvette Rack wrote: Reason for CRM: Pt requests that a copy of his lab results from 08/23/21 be mailed to him. Pt address verified.

## 2021-10-24 NOTE — Telephone Encounter (Signed)
Spoke to pt verified address. Letter will be mailed out today.

## 2021-11-27 ENCOUNTER — Other Ambulatory Visit: Payer: Self-pay

## 2022-02-11 ENCOUNTER — Other Ambulatory Visit: Payer: Self-pay

## 2022-02-11 ENCOUNTER — Emergency Department (HOSPITAL_COMMUNITY)
Admission: EM | Admit: 2022-02-11 | Discharge: 2022-02-11 | Disposition: A | Discharge status: Home / Self Care | Payer: Self-pay | Attending: Emergency Medicine | Admitting: Emergency Medicine

## 2022-02-11 ENCOUNTER — Encounter (HOSPITAL_COMMUNITY): Payer: Self-pay | Admitting: Emergency Medicine

## 2022-02-11 DIAGNOSIS — X19XXXA Contact with other heat and hot substances, initial encounter: Secondary | ICD-10-CM | POA: Insufficient documentation

## 2022-02-11 DIAGNOSIS — T23202A Burn of second degree of left hand, unspecified site, initial encounter: Secondary | ICD-10-CM | POA: Insufficient documentation

## 2022-02-11 MED ORDER — SILVER SULFADIAZINE 1 % EX CREA
1.0000 "application " | TOPICAL_CREAM | Freq: Every day | CUTANEOUS | 0 refills | Status: DC
  Filled 2022-02-12: qty 50, 30d supply, fill #0

## 2022-02-11 MED ORDER — CEPHALEXIN 500 MG PO CAPS
500.0000 mg | ORAL_CAPSULE | Freq: Two times a day (BID) | ORAL | 0 refills | Status: DC
  Filled 2022-02-11: qty 20, 10d supply, fill #0

## 2022-02-11 MED ORDER — CEPHALEXIN 250 MG PO CAPS
500.0000 mg | ORAL_CAPSULE | Freq: Once | ORAL | Status: AC
  Administered 2022-02-11: 500 mg via ORAL
  Filled 2022-02-11: qty 2

## 2022-02-11 MED ORDER — SILVER SULFADIAZINE 1 % EX CREA
TOPICAL_CREAM | Freq: Once | CUTANEOUS | Status: AC
  Filled 2022-02-11: qty 85

## 2022-02-11 NOTE — Discharge Instructions (Signed)
Please follow-up with the burn center.  Use the cream twice daily to your hand  Return for new or worsening symptoms

## 2022-02-11 NOTE — ED Triage Notes (Signed)
Patient accidentally hit his left wrist against a hot exhaust pipe last Friday reports skin burn at left wrist/hand .

## 2022-02-11 NOTE — ED Provider Notes (Signed)
[facility] EMERGENCY DEPARTMENT Provider Note   CSN: [id number] Arrival date & time: 02/11/22  1907    History  Chief Complaint  Patient presents with   Burn     [name] is a 56 y.o. male here for evaluation of burn to left hand. Occurred 4 days ago. Has been putting OTC neosporin on the wound. Had blister that popped. No pain currently. Some mild surrounding erythema. Burn when he accidentally touch hot pipe of a car while at work. No bony tenderness. Has full ROM, no paresthesias, fever, weakness.  Right hand dominant   HPI     Home Medications Prior to Admission medications   Medication Sig Start Date End Date Taking? Authorizing Provider  cephALEXin (KEFLEX) 500 MG capsule Take 1 capsule (500 mg total) by mouth 2 (two) times daily. 02/11/22  Yes [name] A, PA-C  silver sulfADIAZINE (SILVADENE) 1 % cream Apply 1 application. topically daily. 02/11/22  Yes [name] A, PA-C  levothyroxine (SYNTHROID) 125 MCG tablet Take 1 tablet (125 mcg total) by mouth daily before breakfast on an empty stomach. 08/23/21 02/25/22  Damon Rigg, NP  Multiple Vitamin (MULTIVITAMIN WITH MINERALS) TABS tablet Take 1 tablet by mouth daily. Patient not taking: Reported on 08/23/2021    [provider]      Allergies    Lactose intolerance (gi) and Pork-derived products    Review of Systems   Review of Systems  Constitutional: Negative.   HENT: Negative.    Respiratory: Negative.    Cardiovascular: Negative.   Gastrointestinal: Negative.   Genitourinary: Negative.   Musculoskeletal: Negative.   Skin:  Positive for wound.  Neurological: Negative.   All other systems reviewed and are negative.  Physical Exam Updated Vital Signs BP 127/90   Pulse 78   Temp 99.3 F (37.4 C) (Oral)   Resp 16   Ht 5\' 11"  (1.803 m)   Wt 104 kg   SpO2 96%   BMI 31.98 kg/m  Physical Exam Vitals and nursing note reviewed.  Constitutional:       General: He is not in acute distress.    Appearance: He is well-developed. He is not ill-appearing, toxic-appearing or diaphoretic.  HENT:     Head: Normocephalic and atraumatic.  Eyes:     Pupils: Pupils are equal, round, and reactive to light.  Cardiovascular:     Rate and Rhythm: Normal rate and regular rhythm.     Pulses: Normal pulses.          Radial pulses are 2+ on the right side and 2+ on the left side.     Heart sounds: Normal heart sounds.  Pulmonary:     Effort: Pulmonary effort is normal. No respiratory distress.  Abdominal:     General: There is no distension.     Palpations: Abdomen is soft.  Musculoskeletal:        General: Normal range of motion.     Cervical back: Normal range of motion and neck supple.     Comments: No bony tenderness, Full ROM. Compartments soft  Skin:    General: Skin is warm and dry.     Comments: Second-degree burn to radial aspect left wrist, thenar eminence.  Nontender.  Mild surrounding erythema.  No drainage.  Neurological:     General: No focal deficit present.     Mental Status: He is alert and oriented to person, place, and time.     ED Results / Procedures /  Treatments   Labs (all labs ordered are listed, but only abnormal results are displayed) Labs Reviewed - No data to display  EKG None  Radiology No results found.  Procedures Procedures    Medications Ordered in ED Medications  silver sulfADIAZINE (SILVADENE) 1 % cream ( Topical Given 02/11/22 2119)  cephALEXin (KEFLEX) capsule 500 mg (500 mg Oral Given 02/11/22 2119)    ED Course/ Medical Decision Making/ A&P    56 year old history of hypothyroidism here for evaluation of left wrist burn which occurred 4 days PTA.  He is neurovascularly intact.  No bony tenderness.  No systemic or infectious symptoms.  No circumferential or full thickness burn.  Do not feel patient needs labs, imaging, hospitalization, transfer to burn center at this time.  Patient with  second-degree burn, 1% total body surface area radial aspect wrist which occurred 4 days ago.  Tetanus is up-to-date.  Silvadene applied.  Started on Keflex.  He will need to follow-up with wound management.   Ref placed to wound clinic   The patient has been appropriately medically screened and/or stabilized in the ED. I have low suspicion for any other emergent medical condition which would require further screening, evaluation or treatment in the ED or require inpatient management.  Patient is hemodynamically stable and in no acute distress.  Patient able to ambulate in department prior to ED.  Evaluation does not show acute pathology that would require ongoing or additional emergent interventions while in the emergency department or further inpatient treatment.  I have discussed the diagnosis with the patient and answered all questions.  Pain is been managed while in the emergency department and patient has no further complaints prior to discharge.  Patient is comfortable with plan discussed in room and is stable for discharge at this time.  I have discussed strict return precautions for returning to the emergency department.  Patient was encouraged to follow-up with PCP/specialist refer to at discharge.                            Medical Decision Making Amount and/or Complexity of Data Reviewed External Data Reviewed: labs, radiology and notes.  Risk OTC drugs. Prescription drug management. Decision regarding hospitalization. Diagnosis or treatment significantly limited by social determinants of health.         Final Clinical Impression(s) / ED Diagnoses Final diagnoses:  Burn of left hand, second degree, initial encounter    Rx / DC Orders ED Discharge Orders          Ordered    Ambulatory referral to Wound Clinic       Comments: Hand burn   02/11/22 2134    silver sulfADIAZINE (SILVADENE) 1 % cream  Daily        02/11/22 2135    cephALEXin (KEFLEX) 500 MG capsule  2  times daily        02/11/22 2140              [name] A, PA-C 02/11/22 2140    Damon Pander, MD 02/11/22 2325

## 2022-02-12 ENCOUNTER — Other Ambulatory Visit: Payer: Self-pay

## 2022-02-12 ENCOUNTER — Other Ambulatory Visit (HOSPITAL_COMMUNITY): Payer: Self-pay

## 2022-02-21 ENCOUNTER — Ambulatory Visit: Payer: Self-pay | Attending: Nurse Practitioner | Admitting: Nurse Practitioner

## 2022-02-21 ENCOUNTER — Other Ambulatory Visit: Payer: Self-pay | Admitting: Nurse Practitioner

## 2022-02-21 ENCOUNTER — Encounter: Payer: Self-pay | Admitting: Nurse Practitioner

## 2022-02-21 ENCOUNTER — Other Ambulatory Visit: Payer: Self-pay

## 2022-02-21 VITALS — BP 136/88 | HR 63 | Ht 71.0 in | Wt 218.0 lb

## 2022-02-21 DIAGNOSIS — D649 Anemia, unspecified: Secondary | ICD-10-CM

## 2022-02-21 DIAGNOSIS — E890 Postprocedural hypothyroidism: Secondary | ICD-10-CM

## 2022-02-21 DIAGNOSIS — Z1159 Encounter for screening for other viral diseases: Secondary | ICD-10-CM

## 2022-02-21 MED ORDER — LEVOTHYROXINE SODIUM 125 MCG PO TABS
125.0000 ug | ORAL_TABLET | Freq: Every day | ORAL | 2 refills | Status: DC
  Filled 2022-02-21 – 2022-02-27 (×2): qty 90, 90d supply, fill #0
  Filled 2022-06-04: qty 90, 90d supply, fill #1
  Filled 2022-08-20: qty 90, 90d supply, fill #2

## 2022-02-21 NOTE — Progress Notes (Signed)
Eye redness

## 2022-02-21 NOTE — Progress Notes (Signed)
Assessment & Plan:  [name] was seen today for hypothyroidism.  Diagnoses and all orders for this visit:  Postoperative hypothyroidism -     levothyroxine (SYNTHROID) 125 MCG tablet; Take 1 tablet (125 mcg total) by mouth daily before breakfast on an empty stomach. -     Thyroid Panel With TSH  Anemia, unspecified type -     CMP14+EGFR -     CBC with Differential  Need for hepatitis C screening test -     HCV Ab w Reflex to Quant PCR    Patient has been counseled on age-appropriate routine health concerns for screening and prevention. These are reviewed and up-to-date. Referrals have been placed accordingly. Immunizations are up-to-date or declined.    Subjective:   Chief Complaint  Patient presents with   Hypothyroidism   HPI [name] 56 y.o. male presents to office today for follow-up to post ablative hypothyroidism.  He endorses intermittent redness of the eyes which he believes may be related to his thyroid.  Last TSH level was normal.  He endorses taking levothyroxine 125 mg daily as prescribed. Lab Results  Component Value Date   TSH 2.680 08/23/2021     He has a second-degree burn of the left hand with compression glove applied today.  Has an appointment with the wound care clinic next week. Currently using Silvadene and he has completed full course of antibiotic therapy.  Review of Systems  Constitutional:  Negative for fever, malaise/fatigue and weight loss.  HENT: Negative.  Negative for nosebleeds.   Eyes:  Positive for redness (Patient reports however on exam his eyes are not red today). Negative for blurred vision, double vision and photophobia.  Respiratory: Negative.  Negative for cough and shortness of breath.   Cardiovascular: Negative.  Negative for chest pain, palpitations and leg swelling.  Gastrointestinal: Negative.  Negative for heartburn, nausea and vomiting.  Musculoskeletal: Negative.  Negative for myalgias.  Skin:        Burn to  left hand with glove applied  Neurological: Negative.  Negative for dizziness, focal weakness, seizures and headaches.  Psychiatric/Behavioral: Negative.  Negative for suicidal ideas.    Past Medical History:  Diagnosis Date   Thyroid disease    hyper     Past Surgical History:  Procedure Laterality Date   FACIAL LACERATION REPAIR N/A 02/09/2021   Procedure: CLOSURE OF FACIAL LACERATION;  Surgeon: Marcina Millard, MD;  Location: [place];  Service: ENT;  Laterality: N/A;   KNEE ARTHROSCOPY WITH POSTERIOR CRUCIATE LIGAMENT (PCL) RECONSTRUCTION Right 05/10/2021   Procedure: KNEE ARTHROSCOPY WITH POSTERIOR CRUCIATE LIGAMENT (PCL) RECONSTRUCTION;  Surgeon: Hiram Gash, MD;  Location: [place];  Service: Orthopedics;  Laterality: Right;   ORIF ACETABULAR FRACTURE Right 02/10/2021   Procedure: OPEN REDUCTION INTERNAL FIXATION (ORIF) ACETABULAR FRACTURE;  Surgeon: Shona Needles, MD;  Location: [place];  Service: Orthopedics;  Laterality: Right;   ORIF HUMERUS FRACTURE Left 02/10/2021   Procedure: OPEN REDUCTION INTERNAL FIXATION (ORIF) HUMERAL SHAFT FRACTURE;  Surgeon: Shona Needles, MD;  Location: [place];  Service: Orthopedics;  Laterality: Left;   THYROID SURGERY     radiation    History reviewed. No pertinent family history.  Social History Reviewed with no changes to be made today.   Outpatient Medications Prior to Visit  Medication Sig Dispense Refill   Multiple Vitamin (MULTIVITAMIN WITH MINERALS) TABS tablet Take 1 tablet by mouth daily.     levothyroxine (SYNTHROID) 125 MCG tablet Take 1  tablet (125 mcg total) by mouth daily before breakfast on an empty stomach. 90 tablet 1   cephALEXin (KEFLEX) 500 MG capsule Take 1 capsule (500 mg total) by mouth 2 (two) times daily. (Patient not taking: Reported on 02/21/2022) 20 capsule 0   silver sulfADIAZINE (SILVADENE) 1 % cream Apply 1 application. topically daily. (Patient not taking: Reported on 02/21/2022) 50 g 0   No  facility-administered medications prior to visit.    Allergies  Allergen Reactions   Lactose Intolerance (Gi)    Pork-Derived Products        Objective:    BP 136/88   Pulse 63   Ht 5' 11"  (1.803 m)   Wt 218 lb (98.9 kg)   SpO2 99%   BMI 30.40 kg/m  Wt Readings from Last 3 Encounters:  02/21/22 218 lb (98.9 kg)  02/11/22 229 lb 4.5 oz (104 kg)  08/23/21 212 lb 2 oz (96.2 kg)    Physical Exam Vitals and nursing note reviewed.  Constitutional:      Appearance: He is well-developed.  HENT:     Head: Normocephalic and atraumatic.  Cardiovascular:     Rate and Rhythm: Normal rate and regular rhythm.     Heart sounds: Normal heart sounds. No murmur heard.   No friction rub. No gallop.  Pulmonary:     Effort: Pulmonary effort is normal. No tachypnea or respiratory distress.     Breath sounds: Normal breath sounds. No decreased breath sounds, wheezing, rhonchi or rales.  Chest:     Chest wall: No tenderness.  Abdominal:     General: Bowel sounds are normal.     Palpations: Abdomen is soft.  Musculoskeletal:        General: Normal range of motion.     Cervical back: Normal range of motion.  Skin:    General: Skin is warm and dry.     Comments: Wound was not visualized by me today and patient kept his compression glove on during visit  Neurological:     Mental Status: He is alert and oriented to person, place, and time.     Coordination: Coordination normal.  Psychiatric:        Behavior: Behavior normal. Behavior is cooperative.        Thought Content: Thought content normal.        Judgment: Judgment normal.         Patient has been counseled extensively about nutrition and exercise as well as the importance of adherence with medications and regular follow-up. The patient was given clear instructions to go to ER or return to medical center if symptoms don't improve, worsen or new problems develop. The patient verbalized understanding.   Follow-up: Return in  about 6 months (around 08/23/2022).   Gildardo Pounds, FNP-BC [facility] [place], [place]   02/21/2022, 1:16 PM

## 2022-02-22 ENCOUNTER — Encounter (HOSPITAL_BASED_OUTPATIENT_CLINIC_OR_DEPARTMENT_OTHER): Payer: Self-pay | Attending: General Surgery | Admitting: General Surgery

## 2022-02-22 DIAGNOSIS — T23202A Burn of second degree of left hand, unspecified site, initial encounter: Secondary | ICD-10-CM | POA: Insufficient documentation

## 2022-02-22 DIAGNOSIS — E890 Postprocedural hypothyroidism: Secondary | ICD-10-CM | POA: Insufficient documentation

## 2022-02-22 LAB — CBC WITH DIFFERENTIAL/PLATELET
Basophils Absolute: 0.1 10*3/uL (ref 0.0–0.2)
Basos: 2 %
EOS (ABSOLUTE): 0.1 10*3/uL (ref 0.0–0.4)
Eos: 2 %
Hematocrit: 42.1 % (ref 37.5–51.0)
Hemoglobin: 14.2 g/dL (ref 13.0–17.7)
Immature Grans (Abs): 0 10*3/uL (ref 0.0–0.1)
Immature Granulocytes: 0 %
Lymphocytes Absolute: 1.8 10*3/uL (ref 0.7–3.1)
Lymphs: 51 %
MCH: 30.6 pg (ref 26.6–33.0)
MCHC: 33.7 g/dL (ref 31.5–35.7)
MCV: 91 fL (ref 79–97)
Monocytes Absolute: 0.5 10*3/uL (ref 0.1–0.9)
Monocytes: 14 %
Neutrophils Absolute: 1.1 10*3/uL — ABNORMAL LOW (ref 1.4–7.0)
Neutrophils: 31 %
Platelets: 273 10*3/uL (ref 150–450)
RBC: 4.64 x10E6/uL (ref 4.14–5.80)
RDW: 12.8 % (ref 11.6–15.4)
WBC: 3.5 10*3/uL (ref 3.4–10.8)

## 2022-02-22 LAB — CMP14+EGFR
ALT: 17 IU/L (ref 0–44)
AST: 21 IU/L (ref 0–40)
Albumin/Globulin Ratio: 1.8 (ref 1.2–2.2)
Albumin: 4.3 g/dL (ref 3.8–4.9)
Alkaline Phosphatase: 68 IU/L (ref 44–121)
BUN/Creatinine Ratio: 13 (ref 9–20)
BUN: 14 mg/dL (ref 6–24)
Bilirubin Total: 0.5 mg/dL (ref 0.0–1.2)
CO2: 24 mmol/L (ref 20–29)
Calcium: 9.9 mg/dL (ref 8.7–10.2)
Chloride: 102 mmol/L (ref 96–106)
Creatinine, Ser: 1.12 mg/dL (ref 0.76–1.27)
Globulin, Total: 2.4 g/dL (ref 1.5–4.5)
Glucose: 95 mg/dL (ref 70–99)
Potassium: 4.5 mmol/L (ref 3.5–5.2)
Sodium: 138 mmol/L (ref 134–144)
Total Protein: 6.7 g/dL (ref 6.0–8.5)
eGFR: 77 mL/min/{1.73_m2} (ref >59)

## 2022-02-22 LAB — THYROID PANEL WITH TSH
Free Thyroxine Index: 2.2 (ref 1.2–4.9)
T3 Uptake Ratio: 31 % (ref 24–39)
T4, Total: 7.2 ug/dL (ref 4.5–12.0)
TSH: 2.26 u[IU]/mL (ref 0.450–4.500)

## 2022-02-22 LAB — HCV AB W REFLEX TO QUANT PCR: HCV Ab: NONREACTIVE

## 2022-02-22 LAB — HCV INTERPRETATION

## 2022-02-22 NOTE — Progress Notes (Signed)
KIANTE, CIAVARELLA ([id number]) Visit Report for 02/22/2022 Abuse Risk Screen Details Patient Name: Date of Service: [name] Doctor RENCE 02/22/2022 8:00 A M Medical Record Number: [id number] Patient Account Number: [id number] Date of Birth/Sex: Treating RN: [birth year] (56 y.o. [name] Primary Care Leatta Alewine: Bertram [place] Other Clinician: Referring Arlynn Mcdermid: Treating Chidera Dearcos/Extender: Aldean Ast, BRITNI Weeks in Treatment: 0 Abuse Risk Screen Items Answer ABUSE RISK SCREEN: Has anyone close to you tried to hurt or harm you recentlyo No Do you feel uncomfortable with anyone in your familyo No Has anyone forced you do things that you didnt want to doo No Electronic Signature(s) Signed: 02/22/2022 8:55:54 AM By: Karie Schwalbe RN Entered By: Karie Schwalbe on 02/22/2022 08:06:23 -------------------------------------------------------------------------------- Activities of Daily Living Details Patient Name: Date of Service: [name] Doctor RENCE 02/22/2022 8:00 A M Medical Record Number: [id number] Patient Account Number: [id number] Date of Birth/Sex: Treating RN: 1966/05/22 (56 y.o. [name] Primary Care Donnalyn Juran: Bertram Denver Other Clinician: Referring Aylen Stradford: Treating Leydi Winstead/Extender: Aldean Ast, BRITNI Weeks in Treatment: 0 Activities of Daily Living Items Answer Activities of Daily Living (Please select one for each item) Drive Automobile Completely Able T Medications ake Completely Able Use T elephone Completely Able Care for Appearance Completely Able Use T oilet Completely Able Bath / Shower Completely Able Dress Self Completely Able Feed Self Completely Able Walk Completely Able Get In / Out Bed Completely Able Housework Completely Able Prepare Meals Completely Able Handle Money Completely Able Shop for Self Completely Able Electronic Signature(s) Signed: 02/22/2022 8:55:54 AM By: Karie Schwalbe RN Entered By:  Karie Schwalbe on 02/22/2022 08:07:16 -------------------------------------------------------------------------------- Education Screening Details Patient Name: Date of Service: [name] Doctor RENCE 02/22/2022 8:00 A M Medical Record Number: [id number] Patient Account Number: [id number] Date of Birth/Sex: Treating RN: 04-06-1966 (56 y.o. [name] Primary Care Dilara Navarrete: Bertram Denver Other Clinician: Referring Challen Spainhour: Treating Jaiveon Suppes/Extender: Vanna [place] Weeks in Treatment: 0 Primary Learner Assessed: Patient Learning Preferences/Education Level/Primary Language Learning Preference: Explanation, Demonstration, Printed Material Highest Education Level: College or Above Preferred Language: English Cognitive Barrier Language Barrier: No Translator Needed: No Memory Deficit: No Emotional Barrier: No Cultural/Religious Beliefs Affecting Medical Care: No Physical Barrier Impaired Vision: No Impaired Hearing: No Decreased Hand dexterity: No Knowledge/Comprehension Knowledge Level: High Comprehension Level: High Ability to understand written instructions: High Ability to understand verbal instructions: High Motivation Anxiety Level: Calm Cooperation: Cooperative Education Importance: Acknowledges Need Interest in Health Problems: Asks Questions Perception: Coherent Willingness to Engage in Self-Management High Activities: Readiness to Engage in Self-Management High Activities: Electronic Signature(s) Signed: 02/22/2022 8:55:54 AM By: Karie Schwalbe RN Entered By: Karie Schwalbe on 02/22/2022 08:08:26 -------------------------------------------------------------------------------- Fall Risk Assessment Details Patient Name: Date of Service: [name] RENCE 02/22/2022 8:00 A M Medical Record Number: [id number] Patient Account Number: [id number] Date of Birth/Sex: Treating RN: 1965/12/09 (56 y.o. [name] Primary Care  Spencer Peterkin: Bertram Denver Other Clinician: Referring Madysen Faircloth: Treating Jomarie Gellis/Extender: Aldean Ast, BRITNI Weeks in Treatment: 0 Fall Risk Assessment Items Have you had 2 or more falls in the last 12 monthso 0 No Have you had any fall that resulted in injury in the last 12 monthso 0 No FALLS RISK SCREEN History of falling - immediate or within 3 months 0 No Secondary diagnosis (Do you have 2 or more medical diagnoseso) 0 No Ambulatory aid None/bed rest/wheelchair/nurse 0 No Crutches/cane/walker 0 No Furniture 0 No Intravenous therapy Access/Saline/Heparin Lock 0 No Gait/Transferring Normal/ bed rest/ wheelchair 0  No Weak (short steps with or without shuffle, stooped but able to lift head while walking, may seek 0 No support from furniture) Impaired (short steps with shuffle, may have difficulty arising from chair, head down, impaired 0 No balance) Mental Status Oriented to own ability 0 No Electronic Signature(s) Signed: 02/22/2022 8:55:54 AM By: Karie Schwalbe RN Entered By: Karie Schwalbe on 02/22/2022 08:08:36 -------------------------------------------------------------------------------- Foot Assessment Details Patient Name: Date of Service: [name] Doctor RENCE 02/22/2022 8:00 A M Medical Record Number: [id number] Patient Account Number: [id number] Date of Birth/Sex: Treating RN: 1966-07-30 (56 y.o. [name] Primary Care Herron Fero: Bertram Denver Other Clinician: Referring Nirvaan Frett: Treating Karlyn Glasco/Extender: Aldean Ast, BRITNI Weeks in Treatment: 0 Foot Assessment Items Site Locations + = Sensation present, - = Sensation absent, C = Callus, U = Ulcer R = Redness, W = Warmth, M = Maceration, PU = Pre-ulcerative lesion F = Fissure, S = Swelling, D = Dryness Assessment Right: Left: Other Deformity: No No Prior Foot Ulcer: No No Prior Amputation: No No Charcot Joint: No No Ambulatory Status: Ambulatory Without Help Gait:  Steady Electronic Signature(s) Signed: 02/22/2022 8:55:54 AM By: Karie Schwalbe RN Entered By: Karie Schwalbe on 02/22/2022 08:09:10 -------------------------------------------------------------------------------- Nutrition Risk Screening Details Patient Name: Date of Service: Darlin Coco RENCE 02/22/2022 8:00 A M Medical Record Number: [id number] Patient Account Number: [id number] Date of Birth/Sex: Treating RN: Aug 25, 1966 (56 y.o. [name] Primary Care Tristan Bramble: Bertram Denver Other Clinician: Referring Reynaldo Rossman: Treating Dejaun Vidrio/Extender: Aldean Ast, BRITNI Weeks in Treatment: 0 Height (in): 71 Weight (lbs): 318 Body Mass Index (BMI): 44.3 Nutrition Risk Screening Items Score Screening NUTRITION RISK SCREEN: I have an illness or condition that made me change the kind and/or amount of food I eat 0 No I eat fewer than two meals per day 0 No I eat few fruits and vegetables, or milk products 0 No I have three or more drinks of beer, liquor or wine almost every day 0 No I have tooth or mouth problems that make it hard for me to eat 0 No I don't always have enough money to buy the food I need 0 No I eat alone most of the time 0 No I take three or more different prescribed or over-the-counter drugs a day 0 No Without wanting to, I have lost or gained 10 pounds in the last six months 0 No I am not always physically able to shop, cook and/or feed myself 0 No Nutrition Protocols Good Risk Protocol 0 No interventions needed Moderate Risk Protocol High Risk Proctocol Risk Level: Good Risk Score: 0 Electronic Signature(s) Signed: 02/22/2022 8:55:54 AM By: Karie Schwalbe RN Entered By: Karie Schwalbe on 02/22/2022 08:08:57
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
## 2022-02-27 ENCOUNTER — Other Ambulatory Visit: Payer: Self-pay

## 2022-03-02 ENCOUNTER — Encounter (HOSPITAL_BASED_OUTPATIENT_CLINIC_OR_DEPARTMENT_OTHER): Payer: Self-pay | Admitting: General Surgery
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
## 2022-03-05 ENCOUNTER — Ambulatory Visit (HOSPITAL_BASED_OUTPATIENT_CLINIC_OR_DEPARTMENT_OTHER): Payer: Self-pay | Admitting: General Surgery

## 2022-03-13 ENCOUNTER — Encounter (HOSPITAL_BASED_OUTPATIENT_CLINIC_OR_DEPARTMENT_OTHER): Payer: Self-pay | Admitting: General Surgery
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
## 2022-03-14 NOTE — Progress Notes (Signed)
[name], [name] ([id number]) Visit Report for 03/13/2022 Arrival Information Details Patient Name: Date of Service: [name] [name] 03/13/2022 8:45 A M Medical Record Number: [id number] Patient Account Number: [id number] Date of Birth/Sex: Treating RN: 01/01/1966 (56 y.o. Mare Ferrari Primary Care Miyuki Rzasa: Geryl Rankins Other Clinician: Referring Naturi Alarid: Treating Ellery Meroney/Extender: Wilson Singer in Treatment: 2 Visit Information History Since Last Visit Added or deleted any medications: No Patient Arrived: Ambulatory Any new allergies or adverse reactions: No Arrival Time: 09:16 Had a fall or experienced change in No Accompanied By: self activities of daily living that may affect Transfer Assistance: None risk of falls: Signs or symptoms of abuse/neglect since last visito No Hospitalized since last visit: No Implantable device outside of the clinic excluding No cellular tissue based products placed in the center since last visit: Has Dressing in Place as Prescribed: Yes Has Compression in Place as Prescribed: Yes Pain Present Now: No Electronic Signature(s) Signed: 03/13/2022 4:01:45 PM By: Sharyn Creamer RN, BSN Entered By: Sharyn Creamer on 03/13/2022 09:17:21 -------------------------------------------------------------------------------- Clinic Level of Care Assessment Details Patient Name: Date of Service: [name] [name] RENCE 03/13/2022 8:45 A M Medical Record Number: [id number] Patient Account Number: [id number] Date of Birth/Sex: Treating RN: Oct 18, 1965 (56 y.o. Mare Ferrari Primary Care Latiffany Harwick: Geryl Rankins Other Clinician: Referring Voris Tigert: Treating Gjon Letarte/Extender: Wilson Singer in Treatment: 2 Clinic Level of Care Assessment Items TOOL 4 Quantity Score X- 1 0 Use when only an EandM is performed on FOLLOW-UP visit ASSESSMENTS - Nursing Assessment / Reassessment X- 1 10 Reassessment of  Co-morbidities (includes updates in patient status) X- 1 5 Reassessment of Adherence to Treatment Plan ASSESSMENTS - Wound and Skin A ssessment / Reassessment X - Simple Wound Assessment / Reassessment - one wound 1 5 []  - 0 Complex Wound Assessment / Reassessment - multiple wounds []  - 0 Dermatologic / Skin Assessment (not related to wound area) ASSESSMENTS - Focused Assessment []  - 0 Circumferential Edema Measurements - multi extremities []  - 0 Nutritional Assessment / Counseling / Intervention []  - 0 Lower Extremity Assessment (monofilament, tuning fork, pulses) []  - 0 Peripheral Arterial Disease Assessment (using hand held doppler) ASSESSMENTS - Ostomy and/or Continence Assessment and Care []  - 0 Incontinence Assessment and Management []  - 0 Ostomy Care Assessment and Management (repouching, etc.) PROCESS - Coordination of Care X - Simple Patient / Family Education for ongoing care 1 15 []  - 0 Complex (extensive) Patient / Family Education for ongoing care X- 1 10 Staff obtains Programmer, systems, Records, T Results / Process Orders est []  - 0 Staff telephones HHA, Nursing Homes / Clarify orders / etc []  - 0 Routine Transfer to another Facility (non-emergent condition) []  - 0 Routine Hospital Admission (non-emergent condition) []  - 0 New Admissions / Biomedical engineer / Ordering NPWT Apligraf, etc. , []  - 0 Emergency Hospital Admission (emergent condition) X- 1 10 Simple Discharge Coordination []  - 0 Complex (extensive) Discharge Coordination PROCESS - Special Needs []  - 0 Pediatric / Minor Patient Management []  - 0 Isolation Patient Management []  - 0 Hearing / Language / Visual special needs []  - 0 Assessment of Community assistance (transportation, D/C planning, etc.) []  - 0 Additional assistance / Altered mentation []  - 0 Support Surface(s) Assessment (bed, cushion, seat, etc.) INTERVENTIONS - Wound Cleansing / Measurement X - Simple Wound Cleansing -  one wound 1 5 []  - 0 Complex Wound Cleansing - multiple wounds X- 1 5 Wound Imaging (photographs - any  number of wounds) []  - 0 Wound Tracing (instead of photographs) X- 1 5 Simple Wound Measurement - one wound []  - 0 Complex Wound Measurement - multiple wounds INTERVENTIONS - Wound Dressings []  - 0 Small Wound Dressing one or multiple wounds []  - 0 Medium Wound Dressing one or multiple wounds []  - 0 Large Wound Dressing one or multiple wounds []  - 0 Application of Medications - topical []  - 0 Application of Medications - injection INTERVENTIONS - Miscellaneous []  - 0 External ear exam []  - 0 Specimen Collection (cultures, biopsies, blood, body fluids, etc.) []  - 0 Specimen(s) / Culture(s) sent or taken to Lab for analysis []  - 0 Patient Transfer (multiple staff / / Similar devices) []  - 0 Simple Staple / Suture removal (25 or less) []  - 0 Complex Staple / Suture removal (26 or more) []  - 0 Hypo / Hyperglycemic Management (close monitor of Blood Glucose) []  - 0 Ankle / Brachial Index (ABI) - do not check if billed separately X- 1 5 Vital Signs Has the patient been seen at the hospital within the last three years: Yes Total Score: 75 Level Of Care: New/Established - Level 2 Electronic Signature(s) Signed: 03/13/2022 4:01:45 PM By: RN, BSN Entered By: on 03/13/2022 14:56:34 -------------------------------------------------------------------------------- Encounter Discharge Information Details Patient Name: Date of Service: [name] RENCE 03/13/2022 8:45 A M Medical Record Number: Patient Account Number: Date of Birth/Sex: Treating RN: [birth year] (56 y.o. Nurse, adult Primary Care Estephan Gallardo: Other Clinician: Referring Jaxsyn Azam: Treating Cavion Faiola/Extender: in Treatment: 2 Encounter Discharge Information Items Discharge Condition:  Stable Ambulatory Status: Ambulatory Discharge Destination: Home Transportation: Private Auto Accompanied By: self Schedule Follow-up Appointment: Yes Clinical Summary of Care: Patient Declined Electronic Signature(s) Signed: 03/13/2022 4:01:45 PM By: RN, BSN Entered By: 03/15/2022 on 03/13/2022 14:57:23 -------------------------------------------------------------------------------- Lower Extremity Assessment Details Patient Name: Date of Service: [name] Redmond Pulling RENCE 03/13/2022 8:45 A M Medical Record Number: Rosalie Doctor Patient Account Number: 03/15/2022 Date of Birth/Sex: Treating RN: [birth year] (56 y.o. 13/10/1965 Primary Care Tykeem Lanzer: 58 Other Clinician: Referring Airam Heidecker: Treating Tiffine Henigan/Extender: Cline Cools Weeks in Treatment: 2 Electronic Signature(s) Signed: 03/13/2022 4:01:45 PM By: Gerilyn Nestle RN, BSN Entered By: 03/15/2022 on 03/13/2022 09:18:26 -------------------------------------------------------------------------------- Multi Wound Chart Details Patient Name: Date of Service: Redmond Pulling RENCE 03/13/2022 8:45 A M Medical Record Number: Rosalie Doctor Patient Account Number: 03/15/2022 Date of Birth/Sex: Treating RN: Feb 17, 1966 (56 y.o. 13/10/1965 Primary Care Ruta Capece: 58 Other Clinician: Referring Didi Ganaway: Treating Janifer Gieselman/Extender: Cline Cools Weeks in Treatment: 2 Vital Signs Height(in): 71 Pulse(bpm): 64 Weight(lbs): 318 Blood Pressure(mmHg): 133/82 Body Mass Index(BMI): 44.3 Temperature(F): 98.5 Respiratory Rate(breaths/min): 16 Photos: [N/A:N/A] Left Wrist N/A N/A Wound Location: Thermal Burn N/A N/A Wounding Event: 2nd degree Burn N/A N/A Primary Etiology: Anemia N/A N/A Comorbid History: 02/16/2022 N/A N/A Date Acquired: 2 N/A N/A Weeks of Treatment: Open N/A N/A Wound Status: No N/A N/A Wound Recurrence: 0x0x0 N/A  N/A Measurements L x W x D (cm) 0 N/A N/A A (cm) : rea 0 N/A N/A Volume (cm) : 100.00% N/A N/A % Reduction in A rea: 100.00% N/A N/A % Reduction in Volume: Full Thickness Without Exposed N/A N/A Classification: Support Structures None Present N/A N/A Exudate Amount: None Present (0%) N/A N/A Granulation Amount: None Present (0%) N/A N/A Necrotic Amount: Fascia: No N/A N/A Exposed Structures: Fat  Layer (Subcutaneous Tissue): No Tendon: No Muscle: No Joint: No Bone: No Large (67-100%) N/A N/A Epithelialization: Treatment Notes Electronic Signature(s) Signed: 03/13/2022 9:24:41 AM By: Fredirick Maudlin MD FACS Signed: 03/14/2022 5:59:39 PM By: Dellie Catholic RN Entered By: Fredirick Maudlin on 03/13/2022 09:24:41 -------------------------------------------------------------------------------- Multi-Disciplinary Care Plan Details Patient Name: Date of Service: Francoise Ceo RENCE 03/13/2022 8:45 A M Medical Record Number: [id number] Patient Account Number: [id number] Date of Birth/Sex: Treating RN: 1966/01/03 (56 y.o. Mare Ferrari Primary Care Zaray Gatchel: Geryl Rankins Other Clinician: Referring Jahlon Baines: Treating Ilse Billman/Extender: Micki Riley Weeks in Treatment: 2 Active Inactive Electronic Signature(s) Signed: 03/13/2022 4:01:45 PM By: Sharyn Creamer RN, BSN Entered By: Sharyn Creamer on 03/13/2022 14:59:19 -------------------------------------------------------------------------------- Pain Assessment Details Patient Name: Date of Service: Francoise Ceo RENCE 03/13/2022 8:45 A M Medical Record Number: [id number] Patient Account Number: [id number] Date of Birth/Sex: Treating RN: 08-23-1966 (56 y.o. Mare Ferrari Primary Care Amay Mijangos: Geryl Rankins Other Clinician: Referring Brittiany Wiehe: Treating Eilee Schader/Extender: Micki Riley Weeks in Treatment: 2 Active Problems Location of Pain Severity and Description of  Pain Patient Has Paino No Site Locations Pain Management and Medication Current Pain Management: Electronic Signature(s) Signed: 03/13/2022 4:01:45 PM By: Sharyn Creamer RN, BSN Entered By: Sharyn Creamer on 03/13/2022 09:18:20 -------------------------------------------------------------------------------- Patient/Caregiver Education Details Patient Name: Date of Service: [name] [name] RENCE 6/20/2023andnbsp8:45 A M Medical Record Number: [id number] Patient Account Number: [id number] Date of Birth/Gender: Treating RN: [birth year] (56 y.o. Mare Ferrari Primary Care Physician: Geryl Rankins Other Clinician: Referring Physician: Treating Physician/Extender: Wilson Singer in Treatment: 2 Education Assessment Education Provided To: Patient Education Topics Provided Wound/Skin Impairment: Methods: Explain/Verbal Responses: State content correctly Electronic Signature(s) Signed: 03/13/2022 4:01:45 PM By: Sharyn Creamer RN, BSN Entered By: Sharyn Creamer on 03/13/2022 09:20:01 -------------------------------------------------------------------------------- Wound Assessment Details Patient Name: Date of Service: [name] [name] RENCE 03/13/2022 8:45 A M Medical Record Number: [id number] Patient Account Number: [id number] Date of Birth/Sex: Treating RN: 1966/01/14 (56 y.o. Mare Ferrari Primary Care Loran Auguste: Geryl Rankins Other Clinician: Referring Janiyha Montufar: Treating Alura Olveda/Extender: Micki Riley Weeks in Treatment: 2 Wound Status Wound Number: 1 Primary Etiology: 2nd degree Burn Wound Location: Left Wrist Wound Status: Open Wounding Event: Thermal Burn Comorbid History: Anemia Date Acquired: 02/16/2022 Weeks Of Treatment: 2 Clustered Wound: No Photos Wound Measurements Length: (cm) Width: (cm) Depth: (cm) Area: (cm) Volume: (cm) 0 % Reduction in Area: 100% 0 % Reduction in Volume: 100% 0 Epithelialization:  Large (67-100%) 0 Tunneling: No 0 Undermining: No Wound Description Classification: Full Thickness Without Exposed Support Structures Exudate Amount: None Present Foul Odor After Cleansing: No Slough/Fibrino No Wound Bed Granulation Amount: None Present (0%) Exposed Structure Necrotic Amount: None Present (0%) Fascia Exposed: No Fat Layer (Subcutaneous Tissue) Exposed: No Tendon Exposed: No Muscle Exposed: No Joint Exposed: No Bone Exposed: No Electronic Signature(s) Signed: 03/13/2022 4:01:45 PM By: Sharyn Creamer RN, BSN Entered By: Sharyn Creamer on 03/13/2022 09:21:13 -------------------------------------------------------------------------------- [place] Details Patient Name: Date of Service: [name] [name] RENCE 03/13/2022 8:45 A M Medical Record Number: [id number] Patient Account Number: [id number] Date of Birth/Sex: Treating RN: [birth year] (56 y.o. Mare Ferrari Primary Care Pia Jedlicka: Geryl Rankins Other Clinician: Referring Cordaro Mukai: Treating Zuhayr Deeney/Extender: Micki Riley Weeks in Treatment: 2 Vital Signs Time Taken: 09:16 Temperature (F): 98.5 Height (in): 71 Pulse (bpm): 64 Weight (lbs): 318 Respiratory Rate (breaths/min): 16 Body Mass Index (BMI): 44.3 Blood Pressure (mmHg): 133/82 Reference Range: 80 - 120 mg /  dl Electronic Signature(s) Signed: 03/13/2022 4:01:45 PM By: Redmond Pulling RN, BSN Entered By: Redmond Pulling on 03/13/2022 09:17:49

## 2022-06-04 ENCOUNTER — Other Ambulatory Visit: Payer: Self-pay

## 2022-08-20 ENCOUNTER — Encounter: Payer: Self-pay | Admitting: Nurse Practitioner

## 2022-08-20 ENCOUNTER — Ambulatory Visit: Payer: Self-pay | Attending: Nurse Practitioner | Admitting: Nurse Practitioner

## 2022-08-20 ENCOUNTER — Other Ambulatory Visit: Payer: Self-pay

## 2022-08-20 ENCOUNTER — Other Ambulatory Visit (HOSPITAL_COMMUNITY)
Admission: RE | Admit: 2022-08-20 | Discharge: 2022-08-20 | Disposition: A | Discharge status: Home / Self Care | Payer: Self-pay | Source: Ambulatory Visit | Attending: Nurse Practitioner | Admitting: Nurse Practitioner

## 2022-08-20 VITALS — BP 132/85 | HR 72 | Temp 98.0°F | Ht 71.0 in | Wt 221.0 lb

## 2022-08-20 DIAGNOSIS — R350 Frequency of micturition: Secondary | ICD-10-CM

## 2022-08-20 DIAGNOSIS — Z202 Contact with and (suspected) exposure to infections with a predominantly sexual mode of transmission: Secondary | ICD-10-CM | POA: Insufficient documentation

## 2022-08-20 DIAGNOSIS — R972 Elevated prostate specific antigen [PSA]: Secondary | ICD-10-CM

## 2022-08-20 DIAGNOSIS — D696 Thrombocytopenia, unspecified: Secondary | ICD-10-CM

## 2022-08-20 DIAGNOSIS — E871 Hypo-osmolality and hyponatremia: Secondary | ICD-10-CM

## 2022-08-20 DIAGNOSIS — H6122 Impacted cerumen, left ear: Secondary | ICD-10-CM

## 2022-08-20 DIAGNOSIS — E785 Hyperlipidemia, unspecified: Secondary | ICD-10-CM

## 2022-08-20 DIAGNOSIS — E890 Postprocedural hypothyroidism: Secondary | ICD-10-CM | POA: Insufficient documentation

## 2022-08-20 DIAGNOSIS — Z8639 Personal history of other endocrine, nutritional and metabolic disease: Secondary | ICD-10-CM | POA: Insufficient documentation

## 2022-08-20 MED ORDER — DEBROX 6.5 % OT SOLN
5.0000 [drp] | Freq: Two times a day (BID) | OTIC | 0 refills | Status: DC
Start: 2022-08-20 — End: 2023-02-19
  Filled 2022-08-20: qty 15, 30d supply, fill #0

## 2022-08-20 NOTE — Progress Notes (Signed)
Assessment & Plan:  [name] was seen today for hypothyroidism.  Diagnoses and all orders for this visit:  Postoperative hypothyroidism Continue synthroid as prescribed  Elevated PSA -     PSA  Thrombocytopenia (HCC) -     CBC with Differential  Hyponatremia -     CMP14+EGFR  Dyslipidemia, goal LDL below 100 -     Lipid panel  Impacted cerumen of left ear -     carbamide peroxide (DEBROX) 6.5 % OTIC solution; Place 5 drops into the left ear 2 (two) times daily.  Encounter for assessment of STD exposure -     Urine cytology ancillary only -     HIV antibody (with reflex) -     RPR  Frequency of urination -     PSA    Patient has been counseled on age-appropriate routine health concerns for screening and prevention. These are reviewed and up-to-date. Referrals have been placed accordingly. Immunizations are up-to-date or declined.    Subjective:   Chief Complaint  Patient presents with   Hypothyroidism   HPI [name] 56 y.o. male presents to office today for follow-up to hypothyroidism.   Urinary symptoms no weight He reports chronic urinary frequency and urinary urgency. The current episode started  last year  and is staying constant. Patient states symptoms are moderate and severe in intensity, occurring constantly. He  has not been recently treated for similar symptoms.    Associated symptoms: No abdominal pain No back pain  No chills No constipation  No cramping No diarrhea  No discharge No fever  No hematuria No nausea  No vomiting    ---------------------------------------------------------------------------------------  Hypothyroidism Well controlled with levothyroxine 122m daily. He does endorse mild fatigue. Trying to make sure he walks at least 1 mile daily.  Lab Results  Component Value Date   TSH 2.260 02/21/2022     Review of Systems  Constitutional:  Positive for malaise/fatigue. Negative for fever and weight loss.  HENT:  Negative.  Negative for nosebleeds.   Eyes: Negative.  Negative for blurred vision, double vision and photophobia.  Respiratory: Negative.  Negative for cough and shortness of breath.   Cardiovascular: Negative.  Negative for chest pain, palpitations and leg swelling.  Gastrointestinal: Negative.  Negative for heartburn, nausea and vomiting.  Genitourinary:  Positive for frequency and urgency. Negative for dysuria, flank pain and hematuria.  Musculoskeletal: Negative.  Negative for myalgias.  Neurological: Negative.  Negative for dizziness, focal weakness, seizures and headaches.  Psychiatric/Behavioral: Negative.  Negative for suicidal ideas.     Past Medical History:  Diagnosis Date   Thyroid disease    hyper     Past Surgical History:  Procedure Laterality Date   FACIAL LACERATION REPAIR N/A 02/09/2021   Procedure: CLOSURE OF FACIAL LACERATION;  Surgeon: CMarcina Millard MD;  [place] [facility]  Service: ENT;  Laterality: N/A;   KNEE ARTHROSCOPY WITH POSTERIOR CRUCIATE LIGAMENT (PCL) RECONSTRUCTION Right 05/10/2021   Procedure: KNEE ARTHROSCOPY WITH POSTERIOR CRUCIATE LIGAMENT (PCL) RECONSTRUCTION;  Surgeon: VHiram Gash MD;  [place] [facility]  Service: Orthopedics;  Laterality: Right;   ORIF ACETABULAR FRACTURE Right 02/10/2021   Procedure: OPEN REDUCTION INTERNAL FIXATION (ORIF) ACETABULAR FRACTURE;  Surgeon: HShona Needles MD;  [facility]  Service: Orthopedics;  Laterality: Right;   ORIF HUMERUS FRACTURE Left 02/10/2021   Procedure: OPEN REDUCTION INTERNAL FIXATION (ORIF) HUMERAL SHAFT FRACTURE;  Surgeon: HShona Needles MD;  [place] [facility]  Service: Orthopedics;  Laterality: Left;   THYROID SURGERY     radiation    History reviewed. No pertinent family history.  Social History Reviewed with no changes to be made today.   Outpatient Medications Prior to Visit  Medication Sig Dispense Refill   levothyroxine (SYNTHROID) 125 MCG tablet Take 1  tablet (125 mcg total) by mouth daily before breakfast on an empty stomach. 90 tablet 2   cephALEXin (KEFLEX) 500 MG capsule Take 1 capsule (500 mg total) by mouth 2 (two) times daily. (Patient not taking: Reported on 02/21/2022) 20 capsule 0   Multiple Vitamin (MULTIVITAMIN WITH MINERALS) TABS tablet Take 1 tablet by mouth daily. (Patient not taking: Reported on 08/20/2022)     silver sulfADIAZINE (SILVADENE) 1 % cream Apply 1 application. topically daily. (Patient not taking: Reported on 02/21/2022) 50 g 0   No facility-administered medications prior to visit.    Allergies  Allergen Reactions   Lactose Intolerance (Gi)    Pork-Derived Products        Objective:    BP 132/85   Pulse 72   Temp 98 F (36.7 C) (Temporal)   Ht _0  (1.803 m)   Wt 221 lb (100.2 kg)   SpO2 99%   BMI 30.82 kg/m  Wt Readings from Last 3 Encounters:  08/20/22 221 lb (100.2 kg)  02/21/22 218 lb (98.9 kg)  02/11/22 229 lb 4.5 oz (104 kg)    Physical Exam Vitals and nursing note reviewed.  Constitutional:      Appearance: He is well-developed.  HENT:     Head: Normocephalic and atraumatic.  Cardiovascular:     Rate and Rhythm: Normal rate and regular rhythm.     Heart sounds: Normal heart sounds. No murmur heard.    No friction rub. No gallop.  Pulmonary:     Effort: Pulmonary effort is normal. No tachypnea or respiratory distress.     Breath sounds: Normal breath sounds. No decreased breath sounds, wheezing, rhonchi or rales.  Chest:     Chest wall: No tenderness.  Abdominal:     General: Bowel sounds are normal.     Palpations: Abdomen is soft.  Musculoskeletal:        General: Normal range of motion.     Cervical back: Normal range of motion.  Skin:    General: Skin is warm and dry.  Neurological:     Mental Status: He is alert and oriented to person, place, and time.     Coordination: Coordination normal.  Psychiatric:        Behavior: Behavior normal. Behavior is cooperative.         Thought Content: Thought content normal.        Judgment: Judgment normal.          Patient has been counseled extensively about nutrition and exercise as well as the importance of adherence with medications and regular follow-up. The patient was given clear instructions to go to ER or return to medical center if symptoms don't improve, worsen or new problems develop. The patient verbalized understanding.   Follow-up: Return in about 6 months (around 02/18/2023) for Physical ONLY no labs.   Gildardo Pounds, FNP-BC [facility] and [facility] [place], [place]   08/20/2022, 9:05 AM

## 2022-08-21 LAB — CBC WITH DIFFERENTIAL/PLATELET
Basophils Absolute: 0.1 10*3/uL (ref 0.0–0.2)
Basos: 1 %
EOS (ABSOLUTE): 0 10*3/uL (ref 0.0–0.4)
Eos: 1 %
Hematocrit: 43 % (ref 37.5–51.0)
Hemoglobin: 14.5 g/dL (ref 13.0–17.7)
Immature Grans (Abs): 0 10*3/uL (ref 0.0–0.1)
Immature Granulocytes: 1 %
Lymphocytes Absolute: 1.7 10*3/uL (ref 0.7–3.1)
Lymphs: 40 %
MCH: 31 pg (ref 26.6–33.0)
MCHC: 33.7 g/dL (ref 31.5–35.7)
MCV: 92 fL (ref 79–97)
Monocytes Absolute: 0.5 10*3/uL (ref 0.1–0.9)
Monocytes: 12 %
Neutrophils Absolute: 2 10*3/uL (ref 1.4–7.0)
Neutrophils: 45 %
Platelets: 243 10*3/uL (ref 150–450)
RBC: 4.68 x10E6/uL (ref 4.14–5.80)
RDW: 12.6 % (ref 11.6–15.4)
WBC: 4.4 10*3/uL (ref 3.4–10.8)

## 2022-08-21 LAB — HIV ANTIBODY (ROUTINE TESTING W REFLEX): HIV Screen 4th Generation wRfx: NONREACTIVE

## 2022-08-21 LAB — LIPID PANEL
Chol/HDL Ratio: 4.4 ratio (ref 0.0–5.0)
Cholesterol, Total: 219 mg/dL — ABNORMAL HIGH (ref 100–199)
HDL: 50 mg/dL (ref >39)
LDL Chol Calc (NIH): 144 mg/dL — ABNORMAL HIGH (ref 0–99)
Triglycerides: 139 mg/dL (ref 0–149)
VLDL Cholesterol Cal: 25 mg/dL (ref 5–40)

## 2022-08-21 LAB — URINE CYTOLOGY ANCILLARY ONLY
Candida Urine: NEGATIVE
Chlamydia: NEGATIVE
Comment: NEGATIVE
Comment: NEGATIVE
Comment: NORMAL
Neisseria Gonorrhea: NEGATIVE
Trichomonas: NEGATIVE

## 2022-08-21 LAB — CMP14+EGFR
ALT: 22 IU/L (ref 0–44)
AST: 18 IU/L (ref 0–40)
Albumin/Globulin Ratio: 1.6 (ref 1.2–2.2)
Albumin: 4.4 g/dL (ref 3.8–4.9)
Alkaline Phosphatase: 67 IU/L (ref 44–121)
BUN/Creatinine Ratio: 15 (ref 9–20)
BUN: 17 mg/dL (ref 6–24)
Bilirubin Total: 0.6 mg/dL (ref 0.0–1.2)
CO2: 24 mmol/L (ref 20–29)
Calcium: 9.9 mg/dL (ref 8.7–10.2)
Chloride: 103 mmol/L (ref 96–106)
Creatinine, Ser: 1.16 mg/dL (ref 0.76–1.27)
Globulin, Total: 2.7 g/dL (ref 1.5–4.5)
Glucose: 103 mg/dL — ABNORMAL HIGH (ref 70–99)
Potassium: 4.3 mmol/L (ref 3.5–5.2)
Sodium: 140 mmol/L (ref 134–144)
Total Protein: 7.1 g/dL (ref 6.0–8.5)
eGFR: 73 mL/min/{1.73_m2} (ref >59)

## 2022-08-21 LAB — RPR: RPR Ser Ql: NONREACTIVE

## 2022-08-21 LAB — PSA: Prostate Specific Ag, Serum: 1.7 ng/mL (ref 0.0–4.0)

## 2022-12-03 ENCOUNTER — Other Ambulatory Visit: Payer: Self-pay | Admitting: Nurse Practitioner

## 2022-12-03 ENCOUNTER — Other Ambulatory Visit: Payer: Self-pay

## 2022-12-03 DIAGNOSIS — E890 Postprocedural hypothyroidism: Secondary | ICD-10-CM

## 2022-12-03 MED ORDER — LEVOTHYROXINE SODIUM 125 MCG PO TABS
125.0000 ug | ORAL_TABLET | Freq: Every day | ORAL | 0 refills | Status: DC
  Filled 2022-12-03: qty 90, 90d supply, fill #0

## 2022-12-04 ENCOUNTER — Other Ambulatory Visit: Payer: Self-pay

## 2023-02-19 ENCOUNTER — Ambulatory Visit: Payer: Self-pay | Attending: Nurse Practitioner | Admitting: Nurse Practitioner

## 2023-02-19 ENCOUNTER — Encounter: Payer: Self-pay | Admitting: Nurse Practitioner

## 2023-02-19 ENCOUNTER — Other Ambulatory Visit: Payer: Self-pay

## 2023-02-19 VITALS — BP 137/76 | HR 60 | Ht 71.0 in | Wt 223.4 lb

## 2023-02-19 DIAGNOSIS — Z1211 Encounter for screening for malignant neoplasm of colon: Secondary | ICD-10-CM

## 2023-02-19 DIAGNOSIS — D72819 Decreased white blood cell count, unspecified: Secondary | ICD-10-CM

## 2023-02-19 DIAGNOSIS — Z0000 Encounter for general adult medical examination without abnormal findings: Secondary | ICD-10-CM

## 2023-02-19 DIAGNOSIS — H6122 Impacted cerumen, left ear: Secondary | ICD-10-CM

## 2023-02-19 DIAGNOSIS — E890 Postprocedural hypothyroidism: Secondary | ICD-10-CM

## 2023-02-19 DIAGNOSIS — E785 Hyperlipidemia, unspecified: Secondary | ICD-10-CM

## 2023-02-19 DIAGNOSIS — R350 Frequency of micturition: Secondary | ICD-10-CM

## 2023-02-19 MED ORDER — DEBROX 6.5 % OT SOLN
5.0000 [drp] | Freq: Two times a day (BID) | OTIC | 1 refills | Status: DC
Start: 2023-02-19 — End: 2023-08-28
  Filled 2023-02-19: qty 15, 30d supply, fill #0

## 2023-02-19 MED ORDER — LEVOTHYROXINE SODIUM 125 MCG PO TABS
125.0000 ug | ORAL_TABLET | Freq: Every day | ORAL | 1 refills | Status: DC
Start: 2023-02-19 — End: 2023-08-28
  Filled 2023-02-19 – 2023-02-20 (×2): qty 90, 90d supply, fill #0
  Filled 2023-05-28: qty 90, 90d supply, fill #1

## 2023-02-19 NOTE — Progress Notes (Signed)
Assessment & Plan:  [name] was seen today for annual exam.  Diagnoses and all orders for this visit:  Encounter for annual physical exam -     Lipid panel  Postablative hypothyroidism -     Thyroid Panel With TSH -     levothyroxine (SYNTHROID) 125 MCG tablet; Take 1 tablet (125 mcg total) by mouth daily before breakfast.  Dyslipidemia, goal LDL below 100 -     Lipid panel  Leukopenia, unspecified type -     CBC with Differential  Colon cancer screening -     Fecal occult blood, imunochemical  Impacted cerumen of left ear -     carbamide peroxide (DEBROX) 6.5 % OTIC solution; Place 5 drops into the left ear 2 (two) times daily. Unsuccessful ear irrigation  Needs ENT referral  Urine frequency -     Urinalysis, Complete    Patient has been counseled on age-appropriate routine health concerns for screening and prevention. These are reviewed and up-to-date. Referrals have been placed accordingly. Immunizations are up-to-date or declined.    Subjective:   Chief Complaint  Patient presents with   Annual Exam   HPI [name] 57 y.o. male presents to office today for annual physical exam.    Review of Systems  Constitutional:  Negative for fever, malaise/fatigue and weight loss.  HENT: Negative.  Negative for nosebleeds.   Eyes: Negative.  Negative for blurred vision, double vision and photophobia.  Respiratory: Negative.  Negative for cough and shortness of breath.   Cardiovascular: Negative.  Negative for chest pain, palpitations and leg swelling.  Gastrointestinal: Negative.  Negative for heartburn, nausea and vomiting.  Genitourinary:  Positive for frequency. Negative for dysuria, flank pain, hematuria and urgency.  Musculoskeletal: Negative.  Negative for myalgias.  Skin: Negative.   Neurological: Negative.  Negative for dizziness, focal weakness, seizures and headaches.  Endo/Heme/Allergies: Negative.   Psychiatric/Behavioral: Negative.  Negative for  suicidal ideas.     Past Medical History:  Diagnosis Date   Thyroid disease    hyper     Past Surgical History:  Procedure Laterality Date   FACIAL LACERATION REPAIR N/A 02/09/2021   Procedure: CLOSURE OF FACIAL LACERATION;  Surgeon: Rejeana Brock, MD;  Location: [facility] OR;  Service: ENT;  Laterality: N/A;   KNEE ARTHROSCOPY WITH POSTERIOR CRUCIATE LIGAMENT (PCL) RECONSTRUCTION Right 05/10/2021   Procedure: KNEE ARTHROSCOPY WITH POSTERIOR CRUCIATE LIGAMENT (PCL) RECONSTRUCTION;  Surgeon: Bjorn Pippin, MD;  Location: [facility];  Service: Orthopedics;  Laterality: Right;   ORIF ACETABULAR FRACTURE Right 02/10/2021   Procedure: OPEN REDUCTION INTERNAL FIXATION (ORIF) ACETABULAR FRACTURE;  Surgeon: Roby Lofts, MD;  Location: [facility] OR;  Service: Orthopedics;  Laterality: Right;   ORIF HUMERUS FRACTURE Left 02/10/2021   Procedure: OPEN REDUCTION INTERNAL FIXATION (ORIF) HUMERAL SHAFT FRACTURE;  Surgeon: Roby Lofts, MD;  Location: [facility] OR;  Service: Orthopedics;  Laterality: Left;   THYROID SURGERY     radiation    No family history on file.  Social History Reviewed with no changes to be made today.   Outpatient Medications Prior to Visit  Medication Sig Dispense Refill   levothyroxine (SYNTHROID) 125 MCG tablet Take 1 tablet (125 mcg total) by mouth daily before breakfast on an empty stomach. 90 tablet 0   carbamide peroxide (DEBROX) 6.5 % OTIC solution Place 5 drops into the left ear 2 (two) times daily. (Patient not taking: Reported on 02/19/2023) 15 mL 0   No facility-administered medications prior  to visit.    Allergies  Allergen Reactions   Lactose Intolerance (Gi)    Pork-Derived Products        Objective:    BP 137/76 (BP Location: Left Arm, Patient Position: Sitting, Cuff Size: Normal)   Pulse 60   Ht 5\' 11"  (1.803 m)   Wt 223 lb 6.4 oz (101.3 kg)   SpO2 99%   BMI 31.16 kg/m  Wt Readings from Last 3 Encounters:  02/19/23 223 lb 6.4 oz (101.3  kg)  08/20/22 221 lb (100.2 kg)  02/21/22 218 lb (98.9 kg)    Physical Exam Constitutional:      Appearance: He is well-developed.  HENT:     Head: Normocephalic and atraumatic.     Right Ear: Hearing, tympanic membrane, ear canal and external ear normal.     Left Ear: Hearing, ear canal and external ear normal. There is impacted cerumen.     Nose: Nose normal. No mucosal edema or rhinorrhea.     Right Turbinates: Not enlarged.     Left Turbinates: Not enlarged.     Mouth/Throat:     Lips: Pink.     Mouth: Mucous membranes are moist.     Dentition: No gingival swelling, dental abscesses or gum lesions.     Pharynx: Uvula midline.     Tonsils: No tonsillar exudate. 1+ on the right. 1+ on the left.  Eyes:     General: Lids are normal. No scleral icterus.    Extraocular Movements: Extraocular movements intact.     Conjunctiva/sclera: Conjunctivae normal.     Pupils: Pupils are equal, round, and reactive to light.  Neck:     Thyroid: No thyromegaly.     Trachea: No tracheal deviation.  Cardiovascular:     Rate and Rhythm: Normal rate and regular rhythm.     Heart sounds: Normal heart sounds. No murmur heard.    No friction rub. No gallop.  Pulmonary:     Effort: Pulmonary effort is normal. No respiratory distress.     Breath sounds: Normal breath sounds. No wheezing or rales.  Chest:     Chest wall: No mass or tenderness.  Breasts:    Right: No inverted nipple, mass, nipple discharge, skin change or tenderness.     Left: No inverted nipple, mass, nipple discharge, skin change or tenderness.  Abdominal:     General: Bowel sounds are normal. There is no distension.     Palpations: Abdomen is soft. There is no mass.     Tenderness: There is no abdominal tenderness. There is no guarding or rebound.     Hernia: A hernia is present. Hernia is present in the ventral area.  Musculoskeletal:        General: No tenderness or deformity. Normal range of motion.     Cervical back:  Normal range of motion and neck supple.  Lymphadenopathy:     Cervical: No cervical adenopathy.  Skin:    General: Skin is warm and dry.     Capillary Refill: Capillary refill takes less than 2 seconds.     Findings: No erythema.  Neurological:     Mental Status: He is alert and oriented to person, place, and time.     Cranial Nerves: No cranial nerve deficit.     Sensory: Sensation is intact.     Motor: No abnormal muscle tone.     Coordination: Coordination is intact. Coordination normal.     Gait: Gait is intact.  Deep Tendon Reflexes: Reflexes normal.     Reflex Scores:      Patellar reflexes are 1+ on the right side and 1+ on the left side. Psychiatric:        Attention and Perception: Attention normal.        Mood and Affect: Mood normal.        Speech: Speech normal.        Behavior: Behavior normal.        Thought Content: Thought content normal.        Judgment: Judgment normal.          Patient has been counseled extensively about nutrition and exercise as well as the importance of adherence with medications and regular follow-up. The patient was given clear instructions to go to ER or return to medical center if symptoms don't improve, worsen or new problems develop. The patient verbalized understanding.   Follow-up: Return in about 6 months (around 08/22/2023).   Claiborne Rigg, FNP-BC [facility] and [facility] [place], [place] [phone]   02/19/2023, 11:05 AM

## 2023-02-20 ENCOUNTER — Other Ambulatory Visit: Payer: Self-pay

## 2023-02-20 LAB — CBC WITH DIFFERENTIAL/PLATELET
Basophils Absolute: 0.1 10*3/uL (ref 0.0–0.2)
Basos: 2 %
EOS (ABSOLUTE): 0.1 10*3/uL (ref 0.0–0.4)
Eos: 3 %
Hematocrit: 41.8 % (ref 37.5–51.0)
Hemoglobin: 14.5 g/dL (ref 13.0–17.7)
Immature Grans (Abs): 0 10*3/uL (ref 0.0–0.1)
Immature Granulocytes: 1 %
Lymphocytes Absolute: 2.1 10*3/uL (ref 0.7–3.1)
Lymphs: 51 %
MCH: 31.7 pg (ref 26.6–33.0)
MCHC: 34.7 g/dL (ref 31.5–35.7)
MCV: 91 fL (ref 79–97)
Monocytes Absolute: 0.6 10*3/uL (ref 0.1–0.9)
Monocytes: 14 %
Neutrophils Absolute: 1.2 10*3/uL — ABNORMAL LOW (ref 1.4–7.0)
Neutrophils: 29 %
Platelets: 255 10*3/uL (ref 150–450)
RBC: 4.58 x10E6/uL (ref 4.14–5.80)
RDW: 13.3 % (ref 11.6–15.4)
WBC: 4.1 10*3/uL (ref 3.4–10.8)

## 2023-02-20 LAB — LIPID PANEL
Chol/HDL Ratio: 4.2 ratio (ref 0.0–5.0)
Cholesterol, Total: 221 mg/dL — ABNORMAL HIGH (ref 100–199)
HDL: 53 mg/dL (ref >39)
LDL Chol Calc (NIH): 149 mg/dL — ABNORMAL HIGH (ref 0–99)
Triglycerides: 106 mg/dL (ref 0–149)
VLDL Cholesterol Cal: 19 mg/dL (ref 5–40)

## 2023-02-20 LAB — THYROID PANEL WITH TSH
Free Thyroxine Index: 1 — ABNORMAL LOW (ref 1.2–4.9)
T3 Uptake Ratio: 24 % (ref 24–39)
T4, Total: 4.1 ug/dL — ABNORMAL LOW (ref 4.5–12.0)
TSH: 26.9 u[IU]/mL — ABNORMAL HIGH (ref 0.450–4.500)

## 2023-02-21 LAB — URINALYSIS, COMPLETE
Bilirubin, UA: NEGATIVE
Glucose, UA: NEGATIVE
Ketones, UA: NEGATIVE
Nitrite, UA: NEGATIVE
Protein,UA: NEGATIVE
RBC, UA: NEGATIVE
Specific Gravity, UA: 1.018 (ref 1.005–1.030)
Urobilinogen, Ur: 0.2 mg/dL (ref 0.2–1.0)
pH, UA: 6.5 (ref 5.0–7.5)

## 2023-02-21 LAB — MICROSCOPIC EXAMINATION
Bacteria, UA: NONE SEEN
Casts: NONE SEEN /lpf
Epithelial Cells (non renal): NONE SEEN /hpf (ref 0–10)
RBC, Urine: NONE SEEN /hpf (ref 0–2)

## 2023-02-22 LAB — FECAL OCCULT BLOOD, IMMUNOCHEMICAL: Fecal Occult Bld: NEGATIVE

## 2023-02-28 ENCOUNTER — Other Ambulatory Visit: Payer: Self-pay | Admitting: Nurse Practitioner

## 2023-02-28 DIAGNOSIS — E890 Postprocedural hypothyroidism: Secondary | ICD-10-CM
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
## 2023-04-03 ENCOUNTER — Ambulatory Visit: Payer: Self-pay | Attending: Nurse Practitioner

## 2023-04-03 DIAGNOSIS — E890 Postprocedural hypothyroidism: Secondary | ICD-10-CM

## 2023-04-04 ENCOUNTER — Other Ambulatory Visit: Payer: Self-pay | Admitting: Nurse Practitioner

## 2023-04-04 DIAGNOSIS — E890 Postprocedural hypothyroidism: Secondary | ICD-10-CM

## 2023-04-04 LAB — THYROID PANEL WITH TSH
Free Thyroxine Index: 1.7 (ref 1.2–4.9)
T3 Uptake Ratio: 29 % (ref 24–39)
T4, Total: 5.9 ug/dL (ref 4.5–12.0)
TSH: 5.7 u[IU]/mL — ABNORMAL HIGH (ref 0.450–4.500)

## 2023-04-15 ENCOUNTER — Other Ambulatory Visit (HOSPITAL_BASED_OUTPATIENT_CLINIC_OR_DEPARTMENT_OTHER): Payer: Self-pay

## 2023-05-20 ENCOUNTER — Ambulatory Visit: Payer: Self-pay | Attending: Nurse Practitioner

## 2023-05-20 DIAGNOSIS — E890 Postprocedural hypothyroidism: Secondary | ICD-10-CM

## 2023-05-21 LAB — THYROID PANEL WITH TSH
Free Thyroxine Index: 2.5 (ref 1.2–4.9)
T3 Uptake Ratio: 32 % (ref 24–39)
T4, Total: 7.9 ug/dL (ref 4.5–12.0)
TSH: 2.56 u[IU]/mL (ref 0.450–4.500)

## 2023-05-28 ENCOUNTER — Other Ambulatory Visit: Payer: Self-pay

## 2023-05-31 ENCOUNTER — Other Ambulatory Visit: Payer: Self-pay

## 2023-08-23 ENCOUNTER — Ambulatory Visit: Payer: Self-pay | Admitting: Nurse Practitioner

## 2023-08-28 ENCOUNTER — Other Ambulatory Visit: Payer: Self-pay

## 2023-08-28 ENCOUNTER — Other Ambulatory Visit: Payer: Self-pay | Admitting: Nurse Practitioner

## 2023-08-28 ENCOUNTER — Ambulatory Visit: Payer: Self-pay | Attending: Nurse Practitioner | Admitting: Nurse Practitioner

## 2023-08-28 ENCOUNTER — Encounter: Payer: Self-pay | Admitting: Nurse Practitioner

## 2023-08-28 VITALS — BP 131/84 | HR 73 | Ht 71.0 in | Wt 221.6 lb

## 2023-08-28 DIAGNOSIS — E890 Postprocedural hypothyroidism: Secondary | ICD-10-CM

## 2023-08-28 MED ORDER — LEVOTHYROXINE SODIUM 125 MCG PO TABS
125.0000 ug | ORAL_TABLET | Freq: Every day | ORAL | 1 refills | Status: DC
  Filled 2023-08-28: qty 90, 90d supply, fill #0

## 2023-08-28 NOTE — Progress Notes (Signed)
Assessment & Plan:  [name] was seen today for medical management of chronic issues.  Diagnoses and all orders for this visit:  Postoperative hypothyroidism -     Thyroid Panel With TSH -     CMP14+EGFR Continue levothyroxine as prescribed   Patient has been counseled on age-appropriate routine health concerns for screening and prevention. These are reviewed and up-to-date. Referrals have been placed accordingly. Immunizations are up-to-date or declined.    Subjective:   Chief Complaint  Patient presents with   Medical Management of Chronic Issues    [name] 57 y.o. male presents to office today for follow up to hypothyroidism.  He is doing well today with no issues or concerns  Hypothyroidism He does not endorse any symptoms of hypo or hyperthyroidism Lab Results  Component Value Date   TSH 2.560 05/20/2023     BP Readings from Last 3 Encounters:  08/28/23 131/84  02/19/23 137/76  08/20/22 132/85    Review of Systems  Constitutional:  Negative for fever, malaise/fatigue and weight loss.  HENT: Negative.  Negative for nosebleeds.   Eyes: Negative.  Negative for blurred vision, double vision and photophobia.  Respiratory: Negative.  Negative for cough and shortness of breath.   Cardiovascular: Negative.  Negative for chest pain, palpitations and leg swelling.  Gastrointestinal: Negative.  Negative for heartburn, nausea and vomiting.  Musculoskeletal: Negative.  Negative for myalgias.  Neurological: Negative.  Negative for dizziness, focal weakness, seizures and headaches.  Psychiatric/Behavioral: Negative.  Negative for suicidal ideas.     Past Medical History:  Diagnosis Date   Thyroid disease    hyper     Past Surgical History:  Procedure Laterality Date   FACIAL LACERATION REPAIR N/A 02/09/2021   Procedure: CLOSURE OF FACIAL LACERATION;  Surgeon: Rejeana Brock, MD;  Location: [facility] OR;  Service: ENT;  Laterality: N/A;   KNEE ARTHROSCOPY WITH  POSTERIOR CRUCIATE LIGAMENT (PCL) RECONSTRUCTION Right 05/10/2021   Procedure: KNEE ARTHROSCOPY WITH POSTERIOR CRUCIATE LIGAMENT (PCL) RECONSTRUCTION;  Surgeon: Bjorn Pippin, MD;  Location: [facility];  Service: Orthopedics;  Laterality: Right;   ORIF ACETABULAR FRACTURE Right 02/10/2021   Procedure: OPEN REDUCTION INTERNAL FIXATION (ORIF) ACETABULAR FRACTURE;  Surgeon: Roby Lofts, MD;  Location: [facility] OR;  Service: Orthopedics;  Laterality: Right;   ORIF HUMERUS FRACTURE Left 02/10/2021   Procedure: OPEN REDUCTION INTERNAL FIXATION (ORIF) HUMERAL SHAFT FRACTURE;  Surgeon: Roby Lofts, MD;  Location: [facility] OR;  Service: Orthopedics;  Laterality: Left;   THYROID SURGERY     radiation    History reviewed. No pertinent family history.  Social History Reviewed with no changes to be made today.   Outpatient Medications Prior to Visit  Medication Sig Dispense Refill   levothyroxine (SYNTHROID) 125 MCG tablet Take 1 tablet (125 mcg total) by mouth daily before breakfast. 90 tablet 1   carbamide peroxide (DEBROX) 6.5 % OTIC solution Place 5 drops into the left ear 2 (two) times daily. 15 mL 1   No facility-administered medications prior to visit.    Allergies  Allergen Reactions   Lactose Intolerance (Gi)    Pork-Derived Products        Objective:    BP 131/84 (BP Location: Left Arm, Patient Position: Sitting, Cuff Size: Normal)   Pulse 73   Ht 5\' 11"  (1.803 m)   Wt 221 lb 9.6 oz (100.5 kg)   SpO2 97%   BMI 30.91 kg/m  Wt Readings from Last 3 Encounters:  08/28/23  221 lb 9.6 oz (100.5 kg)  02/19/23 223 lb 6.4 oz (101.3 kg)  08/20/22 221 lb (100.2 kg)    Physical Exam Vitals and nursing note reviewed.  Constitutional:      Appearance: He is well-developed.  HENT:     Head: Normocephalic and atraumatic.     Right Ear: There is impacted cerumen.  Cardiovascular:     Rate and Rhythm: Normal rate and regular rhythm.     Heart sounds: Normal heart sounds. No  murmur heard.    No friction rub. No gallop.  Pulmonary:     Effort: Pulmonary effort is normal. No tachypnea or respiratory distress.     Breath sounds: Normal breath sounds. No decreased breath sounds, wheezing, rhonchi or rales.  Chest:     Chest wall: No tenderness.  Abdominal:     General: Bowel sounds are normal.     Palpations: Abdomen is soft.  Musculoskeletal:        General: Normal range of motion.     Cervical back: Normal range of motion.  Skin:    General: Skin is warm and dry.  Neurological:     Mental Status: He is alert and oriented to person, place, and time.     Coordination: Coordination normal.  Psychiatric:        Behavior: Behavior normal. Behavior is cooperative.        Thought Content: Thought content normal.        Judgment: Judgment normal.          Patient has been counseled extensively about nutrition and exercise as well as the importance of adherence with medications and regular follow-up. The patient was given clear instructions to go to ER or return to medical center if symptoms don't improve, worsen or new problems develop. The patient verbalized understanding.   Follow-up: Return in about 5 months (around 01/26/2024).   Claiborne Rigg, FNP-BC [facility] and [facility] [place], [place] [phone]   08/28/2023, 11:29 AM

## 2023-08-29 LAB — THYROID PANEL WITH TSH
Free Thyroxine Index: 1.3 (ref 1.2–4.9)
T3 Uptake Ratio: 25 % (ref 24–39)
T4, Total: 5.2 ug/dL (ref 4.5–12.0)
TSH: 28.8 u[IU]/mL — ABNORMAL HIGH (ref 0.450–4.500)

## 2023-08-29 LAB — CMP14+EGFR
ALT: 35 [IU]/L (ref 0–44)
AST: 34 [IU]/L (ref 0–40)
Albumin: 4.7 g/dL (ref 3.8–4.9)
Alkaline Phosphatase: 72 [IU]/L (ref 44–121)
BUN/Creatinine Ratio: 13 (ref 9–20)
BUN: 17 mg/dL (ref 6–24)
Bilirubin Total: 0.4 mg/dL (ref 0.0–1.2)
CO2: 25 mmol/L (ref 20–29)
Calcium: 10.1 mg/dL (ref 8.7–10.2)
Chloride: 101 mmol/L (ref 96–106)
Creatinine, Ser: 1.31 mg/dL — ABNORMAL HIGH (ref 0.76–1.27)
Globulin, Total: 2.5 g/dL (ref 1.5–4.5)
Glucose: 99 mg/dL (ref 70–99)
Potassium: 4.7 mmol/L (ref 3.5–5.2)
Sodium: 141 mmol/L (ref 134–144)
Total Protein: 7.2 g/dL (ref 6.0–8.5)
eGFR: 63 mL/min/{1.73_m2} (ref >59)

## 2023-09-09 ENCOUNTER — Other Ambulatory Visit: Payer: Self-pay

## 2023-10-09 ENCOUNTER — Ambulatory Visit: Payer: Self-pay | Attending: Nurse Practitioner

## 2023-10-09 DIAGNOSIS — E890 Postprocedural hypothyroidism: Secondary | ICD-10-CM

## 2023-10-10 ENCOUNTER — Other Ambulatory Visit: Payer: Self-pay

## 2023-10-10 ENCOUNTER — Encounter: Payer: Self-pay | Admitting: Nurse Practitioner

## 2023-10-10 ENCOUNTER — Other Ambulatory Visit: Payer: Self-pay | Admitting: Nurse Practitioner

## 2023-10-10 DIAGNOSIS — E890 Postprocedural hypothyroidism: Secondary | ICD-10-CM

## 2023-10-10 LAB — THYROID PANEL WITH TSH
Free Thyroxine Index: 2.3 (ref 1.2–4.9)
T3 Uptake Ratio: 31 % (ref 24–39)
T4, Total: 7.3 ug/dL (ref 4.5–12.0)
TSH: 23.9 u[IU]/mL — ABNORMAL HIGH (ref 0.450–4.500)

## 2023-10-10 MED ORDER — LEVOTHYROXINE SODIUM 137 MCG PO TABS
137.0000 ug | ORAL_TABLET | Freq: Every day | ORAL | 1 refills | Status: DC
  Filled 2023-10-10: qty 30, 30d supply, fill #0
  Filled 2023-11-05: qty 30, 30d supply, fill #1

## 2023-10-11 ENCOUNTER — Other Ambulatory Visit: Payer: Self-pay

## 2023-11-05 ENCOUNTER — Other Ambulatory Visit: Payer: Self-pay

## 2023-11-06 ENCOUNTER — Other Ambulatory Visit: Payer: Self-pay

## 2023-12-04 ENCOUNTER — Other Ambulatory Visit: Payer: Self-pay

## 2023-12-04 ENCOUNTER — Other Ambulatory Visit: Payer: Self-pay | Admitting: Nurse Practitioner

## 2023-12-04 DIAGNOSIS — E890 Postprocedural hypothyroidism: Secondary | ICD-10-CM

## 2023-12-04 MED ORDER — LEVOTHYROXINE SODIUM 137 MCG PO TABS
137.0000 ug | ORAL_TABLET | Freq: Every day | ORAL | 0 refills | Status: DC
  Filled 2023-12-04: qty 30, 30d supply, fill #0

## 2023-12-05 ENCOUNTER — Other Ambulatory Visit: Payer: Self-pay

## 2024-01-03 ENCOUNTER — Other Ambulatory Visit: Payer: Self-pay

## 2024-01-03 ENCOUNTER — Other Ambulatory Visit: Payer: Self-pay | Admitting: Family Medicine

## 2024-01-03 DIAGNOSIS — E890 Postprocedural hypothyroidism: Secondary | ICD-10-CM

## 2024-01-03 MED ORDER — LEVOTHYROXINE SODIUM 137 MCG PO TABS
137.0000 ug | ORAL_TABLET | Freq: Every day | ORAL | 0 refills | Status: DC
  Filled 2024-01-03: qty 30, 30d supply, fill #0

## 2024-01-07 ENCOUNTER — Other Ambulatory Visit: Payer: Self-pay

## 2024-01-23 ENCOUNTER — Other Ambulatory Visit: Payer: Self-pay

## 2024-01-23 ENCOUNTER — Encounter (HOSPITAL_BASED_OUTPATIENT_CLINIC_OR_DEPARTMENT_OTHER): Payer: Self-pay

## 2024-01-23 ENCOUNTER — Inpatient Hospital Stay (HOSPITAL_BASED_OUTPATIENT_CLINIC_OR_DEPARTMENT_OTHER)
Admission: EM | Admit: 2024-01-23 | Discharge: 2024-01-24 | DRG: 603 | Disposition: A | Discharge status: Home / Self Care | Payer: Self-pay | Attending: Internal Medicine | Admitting: Internal Medicine

## 2024-01-23 ENCOUNTER — Emergency Department (HOSPITAL_BASED_OUTPATIENT_CLINIC_OR_DEPARTMENT_OTHER): Payer: Self-pay

## 2024-01-23 DIAGNOSIS — Z8639 Personal history of other endocrine, nutritional and metabolic disease: Secondary | ICD-10-CM

## 2024-01-23 DIAGNOSIS — R030 Elevated blood-pressure reading, without diagnosis of hypertension: Secondary | ICD-10-CM | POA: Diagnosis present

## 2024-01-23 DIAGNOSIS — Z91014 Allergy to mammalian meats: Secondary | ICD-10-CM

## 2024-01-23 DIAGNOSIS — L03213 Periorbital cellulitis: Principal | ICD-10-CM | POA: Diagnosis present

## 2024-01-23 DIAGNOSIS — L03211 Cellulitis of face: Principal | ICD-10-CM | POA: Diagnosis present

## 2024-01-23 DIAGNOSIS — E739 Lactose intolerance, unspecified: Secondary | ICD-10-CM | POA: Diagnosis present

## 2024-01-23 DIAGNOSIS — Z91013 Allergy to seafood: Secondary | ICD-10-CM

## 2024-01-23 DIAGNOSIS — L0201 Cutaneous abscess of face: Secondary | ICD-10-CM | POA: Diagnosis present

## 2024-01-23 DIAGNOSIS — K047 Periapical abscess without sinus: Secondary | ICD-10-CM | POA: Diagnosis present

## 2024-01-23 DIAGNOSIS — Z1152 Encounter for screening for COVID-19: Secondary | ICD-10-CM

## 2024-01-23 DIAGNOSIS — E0500 Thyrotoxicosis with diffuse goiter without thyrotoxic crisis or storm: Secondary | ICD-10-CM | POA: Diagnosis present

## 2024-01-23 DIAGNOSIS — Z79890 Hormone replacement therapy (postmenopausal): Secondary | ICD-10-CM

## 2024-01-23 LAB — CBC WITH DIFFERENTIAL/PLATELET
Abs Immature Granulocytes: 0.04 10*3/uL (ref 0.00–0.07)
Basophils Absolute: 0.1 10*3/uL (ref 0.0–0.1)
Basophils Relative: 1 %
Eosinophils Absolute: 0 10*3/uL (ref 0.0–0.5)
Eosinophils Relative: 1 %
HCT: 42.9 % (ref 39.0–52.0)
Hemoglobin: 14.6 g/dL (ref 13.0–17.0)
Immature Granulocytes: 1 %
Lymphocytes Relative: 20 %
Lymphs Abs: 1.8 10*3/uL (ref 0.7–4.0)
MCH: 31.2 pg (ref 26.0–34.0)
MCHC: 34 g/dL (ref 30.0–36.0)
MCV: 91.7 fL (ref 80.0–100.0)
Monocytes Absolute: 1 10*3/uL (ref 0.1–1.0)
Monocytes Relative: 11 %
Neutro Abs: 5.9 10*3/uL (ref 1.7–7.7)
Neutrophils Relative %: 66 %
Platelets: 259 10*3/uL (ref 150–400)
RBC: 4.68 MIL/uL (ref 4.22–5.81)
RDW: 12.6 % (ref 11.5–15.5)
WBC: 8.8 10*3/uL (ref 4.0–10.5)
nRBC: 0 % (ref 0.0–0.2)

## 2024-01-23 LAB — COMPREHENSIVE METABOLIC PANEL WITH GFR
ALT: 19 U/L (ref 0–44)
AST: 21 U/L (ref 15–41)
Albumin: 5 g/dL (ref 3.5–5.0)
Alkaline Phosphatase: 73 U/L (ref 38–126)
Anion gap: 10 (ref 5–15)
BUN: 14 mg/dL (ref 6–20)
CO2: 27 mmol/L (ref 22–32)
Calcium: 10.5 mg/dL — ABNORMAL HIGH (ref 8.9–10.3)
Chloride: 102 mmol/L (ref 98–111)
Creatinine, Ser: 1.23 mg/dL (ref 0.61–1.24)
GFR, Estimated: >60 mL/min (ref >60)
Glucose, Bld: 110 mg/dL — ABNORMAL HIGH (ref 70–99)
Potassium: 3.9 mmol/L (ref 3.5–5.1)
Sodium: 139 mmol/L (ref 135–145)
Total Bilirubin: 0.7 mg/dL (ref 0.0–1.2)
Total Protein: 7.6 g/dL (ref 6.5–8.1)

## 2024-01-23 LAB — LACTIC ACID, PLASMA: Lactic Acid, Venous: 0.8 mmol/L (ref 0.5–1.9)

## 2024-01-23 LAB — RESP PANEL BY RT-PCR (RSV, FLU A&B, COVID)  RVPGX2
Influenza A by PCR: NEGATIVE
Influenza B by PCR: NEGATIVE
Resp Syncytial Virus by PCR: NEGATIVE
SARS Coronavirus 2 by RT PCR: NEGATIVE

## 2024-01-23 MED ORDER — SODIUM CHLORIDE 0.9 % IV SOLN
3.0000 g | Freq: Once | INTRAVENOUS | Status: AC
  Administered 2024-01-23: 3 g via INTRAVENOUS

## 2024-01-23 MED ORDER — VANCOMYCIN HCL IN DEXTROSE 1-5 GM/200ML-% IV SOLN
1000.0000 mg | Freq: Once | INTRAVENOUS | Status: AC
  Administered 2024-01-23: 1000 mg via INTRAVENOUS
  Filled 2024-01-23: qty 200

## 2024-01-23 MED ORDER — IOHEXOL 300 MG/ML  SOLN
75.0000 mL | Freq: Once | INTRAMUSCULAR | Status: AC | PRN
  Administered 2024-01-23: 75 mL via INTRAVENOUS

## 2024-01-23 NOTE — ED Notes (Signed)
Blood obtained by Triage RN.Marland Kitchen

## 2024-01-23 NOTE — ED Notes (Signed)
 Nasal swab performed by Triage RN.Aaron AasAaron Aas

## 2024-01-23 NOTE — ED Triage Notes (Signed)
 Patient noticed facial swelling at 12 today. Pain began on the left side of the face and then after the patient touched his face he noticed it started swelling. Patient has swelling from below left eye to the jaw. Denies dental pain, trauma or injury to the face. Patient states this happened a few years ago and it was related to a dental issue. Denies numbness. Pain is rated 5/10. States his eye are burning as well.

## 2024-01-23 NOTE — Progress Notes (Signed)
 ED Pharmacy Antibiotic Sign Off An antibiotic consult was received from an ED provider for vancomycin  per pharmacy dosing for  preseptal cellulitis . A chart review was completed to assess appropriateness.  A single dose of ampicillin -sulbactam placed by the ED provider.   The following one time order(s) were placed per pharmacy consult:  vancomycin  2000 mg x 1 dose  Further antibiotic and/or antibiotic pharmacy consults should be ordered by the admitting provider if indicated.   Thank you for allowing pharmacy to be a part of this patient's care.   Dionicio Fray, PharmD, BCPS 01/23/2024 10:02 PM ED Clinical Pharmacist -  [phone]

## 2024-01-23 NOTE — ED Notes (Signed)
 Patient denies Advil  at 5pm. Denies trouble breathing or swallowing.

## 2024-01-23 NOTE — ED Provider Notes (Signed)
 [place] EMERGENCY DEPARTMENT AT [facility] Provider Note   CSN: [id number] Arrival date & time: 01/23/24  1922     History  Chief Complaint  Patient presents with   Facial Swelling    [name] is a 58 y.o. male with medical history of thyroid  disease, hyponatremia, thrombocytopenia, acute blood loss anemia.  Patient presents to ED for evaluation of left-sided facial swelling.  He reports that he woke up this morning with some tenderness just below his left eye.  He states that he was rubbing the spot throughout the course of the day and around 12 PM he noticed that to the left side of his face began to swell.  He states that his face has continued to swell throughout the course of the day.  He reports a history of dental abscesses in the past but denies any dental pain.  He denies any fevers, nausea or vomiting at home.  Denies any trouble swallowing, left ear pain, visual deficits.  Denies medications prior to arrival. HPI     Home Medications Prior to Admission medications   Medication Sig Start Date End Date Taking? Authorizing Provider  levothyroxine  (SYNTHROID ) 137 MCG tablet Take 1 tablet (137 mcg total) by mouth daily before breakfast. Per PCP, pt needs updated TSH/labs on this dose. Please make labs appt. 01/03/24   Newlin, Enobong, MD      Allergies    Lactose intolerance (gi) and Pork-derived products    Review of Systems   Review of Systems  Constitutional:  Negative for fever.  HENT:  Positive for facial swelling.   All other systems reviewed and are negative.   Physical Exam Updated Vital Signs BP (!) 148/100 (BP Location: Left Arm)   Pulse 64   Temp 99 F (37.2 C) (Oral)   Resp 18   Ht 5\' 11"  (1.803 m)   Wt 99.8 kg   SpO2 100%   BMI 30.68 kg/m  Physical Exam Vitals and nursing note reviewed.  Constitutional:      General: He is not in acute distress.    Appearance: He is well-developed.  HENT:     Head: Normocephalic and  atraumatic.     Comments: Left-sided facial swelling, EOMs intact and nonpainful    Mouth/Throat:     Comments: Uvula midline, handling secretions appropriately, no drooling.  No evidence of abscess. Eyes:     Conjunctiva/sclera: Conjunctivae normal.  Cardiovascular:     Rate and Rhythm: Normal rate and regular rhythm.     Heart sounds: No murmur heard. Pulmonary:     Effort: Pulmonary effort is normal. No respiratory distress.     Breath sounds: Normal breath sounds.  Abdominal:     Palpations: Abdomen is soft.     Tenderness: There is no abdominal tenderness.  Musculoskeletal:        General: No swelling.     Cervical back: Neck supple.  Skin:    General: Skin is warm and dry.     Capillary Refill: Capillary refill takes less than 2 seconds.  Neurological:     Mental Status: He is alert.  Psychiatric:        Mood and Affect: Mood normal.       ED Results / Procedures / Treatments   Labs (all labs ordered are listed, but only abnormal results are displayed) Labs Reviewed  COMPREHENSIVE METABOLIC PANEL WITH GFR - Abnormal; Notable for the following components:      Result Value   Glucose, Bld  110 (*)    Calcium 10.5 (*)    All other components within normal limits  RESP PANEL BY RT-PCR (RSV, FLU A&B, COVID)  RVPGX2  CULTURE, BLOOD (ROUTINE X 2)  CULTURE, BLOOD (ROUTINE X 2)  LACTIC ACID, PLASMA  CBC WITH DIFFERENTIAL/PLATELET    EKG None  Radiology CT Maxillofacial W Contrast Result Date: 01/23/2024 CLINICAL DATA:  Left-sided facial swelling, concern for infection. EXAM: CT MAXILLOFACIAL WITH CONTRAST TECHNIQUE: Multidetector CT imaging of the maxillofacial structures was performed with intravenous contrast. Multiplanar CT image reconstructions were also generated. RADIATION DOSE REDUCTION: This exam was performed according to the departmental dose-optimization program which includes automated exposure control, adjustment of the mA and/or kV according to patient size  and/or use of iterative reconstruction technique. CONTRAST:  75mL OMNIPAQUE  IOHEXOL  300 MG/ML  SOLN COMPARISON:  None Available. FINDINGS: Osseous: Periapical lucency involving the left maxillary cuspid and first premolar with associated cortical thinning. Associated defect of the overlying buccal cortex with adjacent abnormality in the overlying soft tissues. Dental caries of the left maxillary first premolar. Chronic appearing irregularity of the right nasal bone. The facial bones are otherwise intact. No condylar dislocation. Orbits: The globes are intact. Lenses are normally located. Normal appearance of the extraocular muscles and optic nerve sheath complexes. The retrobulbar fat is unremarkable. There is partial extension of left facial soft tissue swelling into the preseptal soft tissues of the left orbit without evidence of postseptal orbital involvement. Sinuses: Mucosal thickening in the alveolar recesses of the bilateral maxillary sinuses which is likely odontogenic in origin. Additional mild scattered mucosal thickening in the bilateral ethmoid sinuses. No air-fluid levels. Soft tissues: There is soft tissue swelling overlying the left aspect of the maxilla with a 1.5 x 0.4 x 1.4 cm peripherally enhancing fluid collection noted overlying the left alveolar process of the maxilla. Collection extends over the region of cortical defect described above with extension posteriorly and slightly superiorly over the maxilla. Thickening of adjacent soft tissues. There is no evidence of fluid collection or abscess in the visualized deep spaces of the neck. Mildly prominent left level 1 B cervical nodes, likely reactive. Stranding in the subcutaneous fat of the left facial soft tissues overlying the maxillary sinus with extension into the subcutaneous tissues of the left anterior neck and partial extension into the submental region. There is asymmetric thickening of the left platysma. Soft tissue swelling extends  superiorly into the preseptal soft tissues along the inferior aspect of the left orbit. There is asymmetric swelling of the upper lip with swelling extending into the region of the left nasal labial fold. Limited intracranial: Limited visualization of intracranial structures without evidence of acute abnormality. IMPRESSION: Periapical lucency involving the left maxillary cuspid and first premolar suggestive of periapical abscess. Thinning and defect of the overlying buccal cortex. 1.5 cm enhancing fluid collection in the soft tissues adjacent to the left alveolar process of the maxilla suggestive of associated soft tissue abscess. Left facial soft tissue swelling extending into the preseptal soft tissues of the left orbit. No evidence of postseptal orbital involvement. Dental caries of the left maxillary first premolar. Paranasal sinus disease as above. Mucosal thickening in the bilateral alveolar recesses is likely odontogenic in origin. Electronically Signed   By: Denny Flack M.D.   On: 01/23/2024 21:02    Procedures Procedures   Medications Ordered in ED Medications  vancomycin  (VANCOCIN ) IVPB 1000 mg/200 mL premix (1,000 mg Intravenous New Bag/Given 01/23/24 2231)    Followed by  vancomycin  (  VANCOCIN ) IVPB 1000 mg/200 mL premix (has no administration in time range)  iohexol  (OMNIPAQUE ) 300 MG/ML solution 75 mL (75 mLs Intravenous Contrast Given 01/23/24 2037)  Ampicillin -Sulbactam (UNASYN ) 3 g in sodium chloride  0.9 % 100 mL IVPB (0 g Intravenous Stopped 01/23/24 2229)    ED Course/ Medical Decision Making/ A&P Clinical Course as of 01/23/24 2327  Thu Jan 23, 2024  2213 Admit for iv abx, consult with dentistry in the morning [CG]    Clinical Course User Index [CG] Adel Aden, PA-C   Medical Decision Making Amount and/or Complexity of Data Reviewed Labs: ordered. Radiology: ordered.  Risk Prescription drug management.   58 year old male presents for evaluation.  Please see  HPI for further details.  On examination the patient is afebrile, nontachycardic.  Lung sounds are clear bilaterally, not hypoxic.  Abdomen soft and compressible.  Neurological examination at baseline.  Patient with left-sided facial swelling.  EMs intact nonpainful.  Posterior oropharynx with uvula that is midline, he is handling secretions appropriately, there is no drooling or change in phonation.  Overall nontoxic in appearance.  CBC, CMP, viral panel, lactic acid and blood cultures will be collected.  Will also collect CT maxillofacial with contrast.  CBC without leukocytosis or anemia.  Metabolic panel without electrolyte derangement.  Anion gap 10.  Lactic acid not elevated 0.8.  Viral panel negative for all.  CT maxillofacial shows periapical lucency involving the left maxillary cuspid and first primarily subjective a periapical abscess.  He was also an enhancing fluid collection in the soft tissue adjacent to the left alveolar process of the maxilla suggestive of associated soft tissue abscess.  Patient also has preseptal cellulitis.  Patient started on Unasyn , vancomycin  at this time.  Patient admitted to the hospital, spoke with Dr. Ishmael Marcus of the [facility] hospitalist service.  Patient amenable to plan.  Stable at this time.   Final Clinical Impression(s) / ED Diagnoses Final diagnoses:  Preseptal cellulitis  Facial abscess    Rx / DC Orders ED Discharge Orders     None         Adel Aden, PA-C 01/23/24 2327    Scarlette Currier, MD 01/25/24 1242

## 2024-01-24 ENCOUNTER — Encounter (HOSPITAL_COMMUNITY): Payer: Self-pay | Admitting: Internal Medicine

## 2024-01-24 ENCOUNTER — Other Ambulatory Visit (HOSPITAL_COMMUNITY): Payer: Self-pay

## 2024-01-24 DIAGNOSIS — R030 Elevated blood-pressure reading, without diagnosis of hypertension: Secondary | ICD-10-CM | POA: Insufficient documentation

## 2024-01-24 DIAGNOSIS — L03211 Cellulitis of face: Secondary | ICD-10-CM

## 2024-01-24 DIAGNOSIS — Z8639 Personal history of other endocrine, nutritional and metabolic disease: Secondary | ICD-10-CM

## 2024-01-24 LAB — CBC WITH DIFFERENTIAL/PLATELET
Abs Immature Granulocytes: 0.04 10*3/uL (ref 0.00–0.07)
Basophils Absolute: 0 10*3/uL (ref 0.0–0.1)
Basophils Relative: 1 %
Eosinophils Absolute: 0 10*3/uL (ref 0.0–0.5)
Eosinophils Relative: 0 %
HCT: 40.8 % (ref 39.0–52.0)
Hemoglobin: 14 g/dL (ref 13.0–17.0)
Immature Granulocytes: 1 %
Lymphocytes Relative: 17 %
Lymphs Abs: 1.3 10*3/uL (ref 0.7–4.0)
MCH: 31.3 pg (ref 26.0–34.0)
MCHC: 34.3 g/dL (ref 30.0–36.0)
MCV: 91.3 fL (ref 80.0–100.0)
Monocytes Absolute: 1.1 10*3/uL — ABNORMAL HIGH (ref 0.1–1.0)
Monocytes Relative: 15 %
Neutro Abs: 4.8 10*3/uL (ref 1.7–7.7)
Neutrophils Relative %: 66 %
Platelets: 221 10*3/uL (ref 150–400)
RBC: 4.47 MIL/uL (ref 4.22–5.81)
RDW: 12.6 % (ref 11.5–15.5)
WBC: 7.3 10*3/uL (ref 4.0–10.5)
nRBC: 0 % (ref 0.0–0.2)

## 2024-01-24 LAB — BASIC METABOLIC PANEL WITH GFR
Anion gap: 9 (ref 5–15)
BUN: 8 mg/dL (ref 6–20)
CO2: 26 mmol/L (ref 22–32)
Calcium: 9.4 mg/dL (ref 8.9–10.3)
Chloride: 102 mmol/L (ref 98–111)
Creatinine, Ser: 1.08 mg/dL (ref 0.61–1.24)
GFR, Estimated: >60 mL/min (ref >60)
Glucose, Bld: 125 mg/dL — ABNORMAL HIGH (ref 70–99)
Potassium: 3.6 mmol/L (ref 3.5–5.1)
Sodium: 137 mmol/L (ref 135–145)

## 2024-01-24 LAB — HIV ANTIBODY (ROUTINE TESTING W REFLEX): HIV Screen 4th Generation wRfx: NONREACTIVE

## 2024-01-24 MED ORDER — LACTATED RINGERS IV SOLN: INTRAVENOUS | Status: DC

## 2024-01-24 MED ORDER — SODIUM CHLORIDE 0.9 % IV SOLN
3.0000 g | Freq: Four times a day (QID) | INTRAVENOUS | Status: DC
  Administered 2024-01-24 (×2): 3 g via INTRAVENOUS
  Filled 2024-01-24 (×2): qty 8

## 2024-01-24 MED ORDER — HYDROCODONE-ACETAMINOPHEN 7.5-325 MG/15ML PO SOLN
15.0000 mL | Freq: Four times a day (QID) | ORAL | 0 refills | Status: DC | PRN
  Filled 2024-01-24: qty 120, 2d supply, fill #0

## 2024-01-24 MED ORDER — HYDROMORPHONE HCL 1 MG/ML IJ SOLN
0.5000 mg | INTRAMUSCULAR | Status: DC | PRN
  Administered 2024-01-24 (×2): 0.5 mg via INTRAVENOUS
  Filled 2024-01-24 (×2): qty 0.5

## 2024-01-24 MED ORDER — LEVOTHYROXINE SODIUM 25 MCG PO TABS
137.0000 ug | ORAL_TABLET | Freq: Every day | ORAL | Status: DC
  Administered 2024-01-24: 137 ug via ORAL
  Filled 2024-01-24: qty 1

## 2024-01-24 MED ORDER — AMOXICILLIN-POT CLAVULANATE 875-125 MG PO TABS
1.0000 | ORAL_TABLET | Freq: Two times a day (BID) | ORAL | 0 refills | Status: DC
  Filled 2024-01-24: qty 8, 4d supply, fill #0

## 2024-01-24 NOTE — Plan of Care (Signed)
  Problem: Clinical Measurements: Goal: Will remain free from infection Outcome: Progressing   Problem: Activity: Goal: Risk for activity intolerance will decrease Outcome: Progressing   Problem: Nutrition: Goal: Adequate nutrition will be maintained Outcome: Progressing   Problem: Pain Managment: Goal: General experience of comfort will improve and/or be controlled Outcome: Progressing

## 2024-01-24 NOTE — H&P (Signed)
 History and Physical    [name] ZOX:[id number] DOB: May 17, 1966 DOA: 01/23/2024  Patient coming from: Patient was transferred from [facility] ER.  Chief Complaint: Left facial swelling.  HPI: [name] is a 58 y.o. male with history of Graves' disease status post ablation on Synthroid  presents to the ER because of left facial swelling.  Patient states over the last 2 days he started noticing increasing swelling of the left side of his face with throbbing pain.  Denies any toothache.  The swelling acutely worsened over the last few hours and patient presented to the ER.  Denies any difficulty breathing or eating.  ED Course: In the ED patient had a temperature of 100 F with CT scan maxillofacial showing first pre molar on the left maxillary area or periapical abscess and also alveolar/soft tissue abscess and cellulitis changes.  Cultures were obtained started on empiric antibiotics and admitted for further management.  Labs show WBC count of 8.8 calcium of 10.5.    Review of Systems: As per HPI, rest all negative.   Past Medical History:  Diagnosis Date   Thyroid  disease    hyper     Past Surgical History:  Procedure Laterality Date   FACIAL LACERATION REPAIR N/A 02/09/2021   Procedure: CLOSURE OF FACIAL LACERATION;  Surgeon: Mearl Spice, MD;  Location: [facility] OR;  Service: ENT;  Laterality: N/A;   KNEE ARTHROSCOPY WITH POSTERIOR CRUCIATE LIGAMENT (PCL) RECONSTRUCTION Right 05/10/2021   Procedure: KNEE ARTHROSCOPY WITH POSTERIOR CRUCIATE LIGAMENT (PCL) RECONSTRUCTION;  Surgeon: Micheline Ahr, MD;  Location: [facility];  Service: Orthopedics;  Laterality: Right;   ORIF ACETABULAR FRACTURE Right 02/10/2021   Procedure: OPEN REDUCTION INTERNAL FIXATION (ORIF) ACETABULAR FRACTURE;  Surgeon: Laneta Pintos, MD;  Location: [facility] OR;  Service: Orthopedics;  Laterality: Right;   ORIF HUMERUS FRACTURE Left 02/10/2021   Procedure: OPEN REDUCTION INTERNAL FIXATION  (ORIF) HUMERAL SHAFT FRACTURE;  Surgeon: Laneta Pintos, MD;  Location: [facility] OR;  Service: Orthopedics;  Laterality: Left;   THYROID  SURGERY     radiation     reports that he has never smoked. He has never used smokeless tobacco. He reports current alcohol  use. He reports that he does not use drugs.  Allergies  Allergen Reactions   Shellfish Allergy Swelling    Per patient report 5/2   Lactose Intolerance (Gi)    Pork-Derived Products     History reviewed. No pertinent family history.  Prior to Admission medications   Medication Sig Start Date End Date Taking? Authorizing Provider  levothyroxine  (SYNTHROID ) 137 MCG tablet Take 1 tablet (137 mcg total) by mouth daily before breakfast. Per PCP, pt needs updated TSH/labs on this dose. Please make labs appt. 01/03/24   Newlin, Enobong, MD    Physical Exam: Constitutional: Moderately built and nourished. Vitals:   01/23/24 2200 01/23/24 2300 01/24/24 0000 01/24/24 0044  BP: (!) 156/99 (!) 148/100 (!) 151/91 (!) 145/94  Pulse: 69 64 72 69  Resp: 18 18 17 18   Temp:  99 F (37.2 C) 99 F (37.2 C) 98.6 F (37 C)  TempSrc:  Oral Oral Oral  SpO2: 100% 100% 99% 100%  Weight:      Height:       Eyes: Anicteric no pallor. ENMT: Left facial swelling with mild tenderness. Neck: No neck rigidity. Respiratory: No rhonchi or crepitations. Cardiovascular: S1-S2 heard. Abdomen: Soft nontender bowel sound present. Musculoskeletal: No edema. Skin: No rash. Neurologic: Alert awake oriented to time place and person.  Moves all extremities. Psychiatric: Appears normal.  Normal affect.   Labs on Admission: I have personally reviewed following labs and imaging studies  CBC: Recent Labs  Lab 01/23/24 1950  WBC 8.8  NEUTROABS 5.9  HGB 14.6  HCT 42.9  MCV 91.7  PLT 259   Basic Metabolic Panel: Recent Labs  Lab 01/23/24 1950  NA 139  K 3.9  CL 102  CO2 27  GLUCOSE 110*  BUN 14  CREATININE 1.23  CALCIUM 10.5*    GFR: Estimated Creatinine Clearance: 78.8 mL/min (by C-G formula based on SCr of 1.23 mg/dL). Liver Function Tests: Recent Labs  Lab 01/23/24 1950  AST 21  ALT 19  ALKPHOS 73  BILITOT 0.7  PROT 7.6  ALBUMIN  5.0   No results for input(s): "LIPASE", "AMYLASE" in the last 168 hours. No results for input(s): "AMMONIA" in the last 168 hours. Coagulation Profile: No results for input(s): "INR", "PROTIME" in the last 168 hours. Cardiac Enzymes: No results for input(s): "CKTOTAL", "CKMB", "CKMBINDEX", "TROPONINI" in the last 168 hours. BNP (last 3 results) No results for input(s): "PROBNP" in the last 8760 hours. HbA1C: No results for input(s): "HGBA1C" in the last 72 hours. CBG: No results for input(s): "GLUCAP" in the last 168 hours. Lipid Profile: No results for input(s): "CHOL", "HDL", "LDLCALC", "TRIG", "CHOLHDL", "LDLDIRECT" in the last 72 hours. Thyroid  Function Tests: No results for input(s): "TSH", "T4TOTAL", "FREET4", "T3FREE", "THYROIDAB" in the last 72 hours. Anemia Panel: No results for input(s): "VITAMINB12", "FOLATE", "FERRITIN", "TIBC", "IRON", "RETICCTPCT" in the last 72 hours. Urine analysis:    Component Value Date/Time   COLORURINE YELLOW 02/21/2021 1325   APPEARANCEUR Clear 02/20/2023 0854   LABSPEC 1.024 02/21/2021 1325   PHURINE 5.0 02/21/2021 1325   GLUCOSEU Negative 02/20/2023 0854   HGBUR SMALL (A) 02/21/2021 1325   BILIRUBINUR Negative 02/20/2023 0854   KETONESUR NEGATIVE 02/21/2021 1325   PROTEINUR Negative 02/20/2023 0854   PROTEINUR NEGATIVE 02/21/2021 1325   NITRITE Negative 02/20/2023 0854   NITRITE NEGATIVE 02/21/2021 1325   LEUKOCYTESUR 1+ (A) 02/20/2023 0854   LEUKOCYTESUR NEGATIVE 02/21/2021 1325   Sepsis Labs: @LABRCNTIP (procalcitonin:4,lacticidven:4) ) Recent Results (from the past 240 hours)  Resp panel by RT-PCR (RSV, Flu A&B, Covid) Anterior Nasal Swab     Status: None   Collection Time: 01/23/24  8:01 PM   Specimen: Anterior  Nasal Swab  Result Value Ref Range Status   SARS Coronavirus 2 by RT PCR NEGATIVE NEGATIVE Final    Comment: (NOTE) SARS-CoV-2 target nucleic acids are NOT DETECTED.  The SARS-CoV-2 RNA is generally detectable in upper respiratory specimens during the acute phase of infection. The lowest concentration of SARS-CoV-2 viral copies this assay can detect is 138 copies/mL. A negative result does not preclude SARS-Cov-2 infection and should not be used as the sole basis for treatment or other patient management decisions. A negative result may occur with  improper specimen collection/handling, submission of specimen other than nasopharyngeal swab, presence of viral mutation(s) within the areas targeted by this assay, and inadequate number of viral copies(<138 copies/mL). A negative result must be combined with clinical observations, patient history, and epidemiological information. The expected result is Negative.  Fact Sheet for Patients:  BloggerCourse.com  Fact Sheet for Healthcare Providers:  SeriousBroker.it  This test is no t yet approved or cleared by the [place]  FDA and  has been authorized for detection and/or diagnosis of SARS-CoV-2 by FDA under an Emergency Use Authorization (EUA). This EUA will remain  in  effect (meaning this test can be used) for the duration of the COVID-19 declaration under Section 564(b)(1) of the Act, 21 U.S.C.section 360bbb-3(b)(1), unless the authorization is terminated  or revoked sooner.       Influenza A by PCR NEGATIVE NEGATIVE Final   Influenza B by PCR NEGATIVE NEGATIVE Final    Comment: (NOTE) The Xpert Xpress SARS-CoV-2/FLU/RSV plus assay is intended as an aid in the diagnosis of influenza from Nasopharyngeal swab specimens and should not be used as a sole basis for treatment. Nasal washings and aspirates are unacceptable for Xpert Xpress SARS-CoV-2/FLU/RSV testing.  Fact Sheet for  Patients: BloggerCourse.com  Fact Sheet for Healthcare Providers: SeriousBroker.it  This test is not yet approved or cleared by the [place]  FDA and has been authorized for detection and/or diagnosis of SARS-CoV-2 by FDA under an Emergency Use Authorization (EUA). This EUA will remain in effect (meaning this test can be used) for the duration of the COVID-19 declaration under Section 564(b)(1) of the Act, 21 U.S.C. section 360bbb-3(b)(1), unless the authorization is terminated or revoked.     Resp Syncytial Virus by PCR NEGATIVE NEGATIVE Final    Comment: (NOTE) Fact Sheet for Patients: BloggerCourse.com  Fact Sheet for Healthcare Providers: SeriousBroker.it  This test is not yet approved or cleared by the [place]  FDA and has been authorized for detection and/or diagnosis of SARS-CoV-2 by FDA under an Emergency Use Authorization (EUA). This EUA will remain in effect (meaning this test can be used) for the duration of the COVID-19 declaration under Section 564(b)(1) of the Act, 21 U.S.C. section 360bbb-3(b)(1), unless the authorization is terminated or revoked.  Performed at [facility], [address] [place], [place] [address]      Radiological Exams on Admission: CT Maxillofacial W Contrast Result Date: 01/23/2024 CLINICAL DATA:  Left-sided facial swelling, concern for infection. EXAM: CT MAXILLOFACIAL WITH CONTRAST TECHNIQUE: Multidetector CT imaging of the maxillofacial structures was performed with intravenous contrast. Multiplanar CT image reconstructions were also generated. RADIATION DOSE REDUCTION: This exam was performed according to the departmental dose-optimization program which includes automated exposure control, adjustment of the mA and/or kV according to patient size and/or use of iterative reconstruction technique. CONTRAST:  75mL  OMNIPAQUE  IOHEXOL  300 MG/ML  SOLN COMPARISON:  None Available. FINDINGS: Osseous: Periapical lucency involving the left maxillary cuspid and first premolar with associated cortical thinning. Associated defect of the overlying buccal cortex with adjacent abnormality in the overlying soft tissues. Dental caries of the left maxillary first premolar. Chronic appearing irregularity of the right nasal bone. The facial bones are otherwise intact. No condylar dislocation. Orbits: The globes are intact. Lenses are normally located. Normal appearance of the extraocular muscles and optic nerve sheath complexes. The retrobulbar fat is unremarkable. There is partial extension of left facial soft tissue swelling into the preseptal soft tissues of the left orbit without evidence of postseptal orbital involvement. Sinuses: Mucosal thickening in the alveolar recesses of the bilateral maxillary sinuses which is likely odontogenic in origin. Additional mild scattered mucosal thickening in the bilateral ethmoid sinuses. No air-fluid levels. Soft tissues: There is soft tissue swelling overlying the left aspect of the maxilla with a 1.5 x 0.4 x 1.4 cm peripherally enhancing fluid collection noted overlying the left alveolar process of the maxilla. Collection extends over the region of cortical defect described above with extension posteriorly and slightly superiorly over the maxilla. Thickening of adjacent soft tissues. There is no evidence of fluid collection or abscess in the visualized deep  spaces of the neck. Mildly prominent left level 1 B cervical nodes, likely reactive. Stranding in the subcutaneous fat of the left facial soft tissues overlying the maxillary sinus with extension into the subcutaneous tissues of the left anterior neck and partial extension into the submental region. There is asymmetric thickening of the left platysma. Soft tissue swelling extends superiorly into the preseptal soft tissues along the inferior aspect  of the left orbit. There is asymmetric swelling of the upper lip with swelling extending into the region of the left nasal labial fold. Limited intracranial: Limited visualization of intracranial structures without evidence of acute abnormality. IMPRESSION: Periapical lucency involving the left maxillary cuspid and first premolar suggestive of periapical abscess. Thinning and defect of the overlying buccal cortex. 1.5 cm enhancing fluid collection in the soft tissues adjacent to the left alveolar process of the maxilla suggestive of associated soft tissue abscess. Left facial soft tissue swelling extending into the preseptal soft tissues of the left orbit. No evidence of postseptal orbital involvement. Dental caries of the left maxillary first premolar. Paranasal sinus disease as above. Mucosal thickening in the bilateral alveolar recesses is likely odontogenic in origin. Electronically Signed   By: Denny Flack M.D.   On: 01/23/2024 21:02     Assessment/Plan Principal Problem:   Facial cellulitis Active Problems:   History of Graves' disease   Hypercalcemia    Left facial cellulitis with periapical abscess and also soft tissue abscess as seen on the CAT scan involving the maxillary side.  Will keep patient on IV Unasyn  and follow cultures continue IV fluids.  Will need oral surgery consult. History of Graves' disease status post ablation on Synthroid  replacement. Elevated blood pressure readings follow blood pressure trends. Mild hypercalcemia recheck after hydration.  If still persistently elevated further workup.  Since patient has left facial cellulitis with periapical abscess will need further management and more than 2 midnight stay.   DVT prophylaxis: SCDs. Code Status: Full code. Family Communication: Discussed with patient. Disposition Plan: Medical floor. Consults called: None. Admission status: Inpatient.

## 2024-01-24 NOTE — Progress Notes (Signed)
 Reviewed AVS, patient expressed understanding of medications, MD follow up reviewed.   Removed IV, Site clean, dry and intact.  Patient states all belongings brought to the hospital at time of admission are accounted for and packed to take home.  Patient informed and expressed understanding to pick up medications from [facility] pharmacy. Patient refused to go to discharge lounge to wait for transportation home; patient states he is wanting for his wife to bring him fresh clothes to change into at discharge. Teacher, music informed.

## 2024-01-24 NOTE — Progress Notes (Signed)
 Received pt from transport services, tx from [facility] d/t L facial swelling, vanc infusing. Pt ambulatory, A&Ox4, RA, ambulates independently, VSS, offered to notify family of location of admission and pt declined.  Notified admission team for unit via page, received call and was notified page was received and they would be around shortly to complete admission.

## 2024-01-24 NOTE — Discharge Summary (Signed)
 Physician Discharge Summary  Rockland Alberding WUJ:[id number] DOB: [birth year] DOA: 01/23/2024  PCP: Damon Dean, NP  Admit date: 01/23/2024 Discharge date: 01/24/2024  Admitted From: Home Disposition: Home  Recommendations for Outpatient Follow-up:  Follow up with PCP in 1-2 weeks Follow-up with dentist as discussed in 1 to 2 weeks if no improvement  Home Health: None Equipment/Devices: None  Discharge Condition: Stable CODE STATUS: Full Diet recommendation: Low-salt low-fat low-carb diet  Brief/Interim Summary: [name] is a 57 y.o. male with history of Graves' disease status post ablation on Synthroid  presents to the ER because of left facial swelling.  Imaging concerning for cellulitis of the first premolar area, cannot rule out abscess.  Please discuss with patient today, given no access here to dental surgery or oral surgery we discussed continued oral antibiotics given he has no issues tolerating p.o., taking medications, has no stridor or respiratory symptoms.  He currently requests to transition to oral antibiotics for outpatient follow-up with his own dentist in the next 1 to 2 weeks pending his improvement.  Discussed if he should have worsening symptoms, shortness of breath stridor difficulty swallowing he should return immediately back to the hospital for further evaluation and care.  Discharge Diagnoses:  Principal Problem:   Facial cellulitis Active Problems:   History of Graves' disease   Hypercalcemia   Elevated blood pressure reading    Discharge Instructions  Discharge Instructions     Call MD for:  difficulty breathing, headache or visual disturbances   Complete by: As directed    Call MD for:  extreme fatigue   Complete by: As directed    Call MD for:  hives   Complete by: As directed    Call MD for:  persistant dizziness or light-headedness   Complete by: As directed    Call MD for:  persistant nausea and vomiting   Complete by: As  directed    Call MD for:  redness, tenderness, or signs of infection (pain, swelling, redness, odor or green/yellow discharge around incision site)   Complete by: As directed    Call MD for:  severe uncontrolled pain   Complete by: As directed    Call MD for:  temperature >100.4   Complete by: As directed    Diet - low sodium heart healthy   Complete by: As directed    Discharge instructions   Complete by: As directed    Follow up with Dentist in the next 1-2 weeks if no improvement   Increase activity slowly   Complete by: As directed       Allergies as of 01/24/2024       Reactions   Shellfish Allergy Swelling   Per patient report 5/2   Lactose Intolerance (gi)    Unknown rxn.   Pork-derived Products    Religious.        Medication List     TAKE these medications    amoxicillin -clavulanate 875-125 MG tablet Commonly known as: AUGMENTIN  Take 1 tablet by mouth 2 (two) times daily for 4 days.   HYDROcodone -acetaminophen  7.5-325 mg/15 ml solution Commonly known as: HYCET Take 15 mLs by mouth 4 (four) times daily as needed for moderate pain (pain score 4-6).   levothyroxine  137 MCG tablet Commonly known as: SYNTHROID  Take 1 tablet (137 mcg total) by mouth daily before breakfast. Per PCP, pt needs updated TSH/labs on this dose. Please make labs appt.        Allergies  Allergen Reactions   Shellfish Allergy  Swelling    Per patient report 5/2   Lactose Intolerance (Gi)     Unknown rxn.   Pork-Derived Products     Religious.    Consultations: None  Procedures/Studies: CT Maxillofacial W Contrast Result Date: 01/23/2024 CLINICAL DATA:  Left-sided facial swelling, concern for infection. EXAM: CT MAXILLOFACIAL WITH CONTRAST TECHNIQUE: Multidetector CT imaging of the maxillofacial structures was performed with intravenous contrast. Multiplanar CT image reconstructions were also generated. RADIATION DOSE REDUCTION: This exam was performed according to the  departmental dose-optimization program which includes automated exposure control, adjustment of the mA and/or kV according to patient size and/or use of iterative reconstruction technique. CONTRAST:  75mL OMNIPAQUE  IOHEXOL  300 MG/ML  SOLN COMPARISON:  None Available. FINDINGS: Osseous: Periapical lucency involving the left maxillary cuspid and first premolar with associated cortical thinning. Associated defect of the overlying buccal cortex with adjacent abnormality in the overlying soft tissues. Dental caries of the left maxillary first premolar. Chronic appearing irregularity of the right nasal bone. The facial bones are otherwise intact. No condylar dislocation. Orbits: The globes are intact. Lenses are normally located. Normal appearance of the extraocular muscles and optic nerve sheath complexes. The retrobulbar fat is unremarkable. There is partial extension of left facial soft tissue swelling into the preseptal soft tissues of the left orbit without evidence of postseptal orbital involvement. Sinuses: Mucosal thickening in the alveolar recesses of the bilateral maxillary sinuses which is likely odontogenic in origin. Additional mild scattered mucosal thickening in the bilateral ethmoid sinuses. No air-fluid levels. Soft tissues: There is soft tissue swelling overlying the left aspect of the maxilla with a 1.5 x 0.4 x 1.4 cm peripherally enhancing fluid collection noted overlying the left alveolar process of the maxilla. Collection extends over the region of cortical defect described above with extension posteriorly and slightly superiorly over the maxilla. Thickening of adjacent soft tissues. There is no evidence of fluid collection or abscess in the visualized deep spaces of the neck. Mildly prominent left level 1 B cervical nodes, likely reactive. Stranding in the subcutaneous fat of the left facial soft tissues overlying the maxillary sinus with extension into the subcutaneous tissues of the left anterior  neck and partial extension into the submental region. There is asymmetric thickening of the left platysma. Soft tissue swelling extends superiorly into the preseptal soft tissues along the inferior aspect of the left orbit. There is asymmetric swelling of the upper lip with swelling extending into the region of the left nasal labial fold. Limited intracranial: Limited visualization of intracranial structures without evidence of acute abnormality. IMPRESSION: Periapical lucency involving the left maxillary cuspid and first premolar suggestive of periapical abscess. Thinning and defect of the overlying buccal cortex. 1.5 cm enhancing fluid collection in the soft tissues adjacent to the left alveolar process of the maxilla suggestive of associated soft tissue abscess. Left facial soft tissue swelling extending into the preseptal soft tissues of the left orbit. No evidence of postseptal orbital involvement. Dental caries of the left maxillary first premolar. Paranasal sinus disease as above. Mucosal thickening in the bilateral alveolar recesses is likely odontogenic in origin. Electronically Signed   By: Denny Flack M.D.   On: 01/23/2024 21:02     Subjective: No acute issues or events overnight   Discharge Exam: Vitals:   01/24/24 0458 01/24/24 0802  BP: (!) 153/89 (!) 148/93  Pulse: 86 78  Resp: 20 18  Temp: 99 F (37.2 C) 98.9 F (37.2 C)  SpO2: 99% 94%   Vitals:  01/24/24 0000 01/24/24 0044 01/24/24 0458 01/24/24 0802  BP: (!) 151/91 (!) 145/94 (!) 153/89 (!) 148/93  Pulse: 72 69 86 78  Resp: 17 18 20 18   Temp: 99 F (37.2 C) 98.6 F (37 C) 99 F (37.2 C) 98.9 F (37.2 C)  TempSrc: Oral Oral Oral Oral  SpO2: 99% 100% 99% 94%  Weight:      Height:        General: Pt is alert, awake, not in acute distress HEENT: Left anterior facial swelling at the left anterior jawline, tender to palpation Cardiovascular: RRR, S1/S2 +, no rubs, no gallops Respiratory: CTA bilaterally, no  wheezing, no rhonchi Abdominal: Soft, NT, ND, bowel sounds + Extremities: no edema, no cyanosis    The results of significant diagnostics from this hospitalization (including imaging, microbiology, ancillary and laboratory) are listed below for reference.     Microbiology: Recent Results (from the past 240 hours)  Blood culture (routine x 2)     Status: None (Preliminary result)   Collection Time: 01/23/24  7:54 PM   Specimen: BLOOD  Result Value Ref Range Status   Specimen Description   Final    BLOOD RIGHT ANTECUBITAL Performed at [facility], [address] [place], [place] [address]    Special Requests   Final    Blood Culture adequate volume BOTTLES DRAWN AEROBIC AND ANAEROBIC Performed at [facility], [address], [place], [place] [address]    Culture   Final    NO GROWTH < 12 HOURS Performed at [facility], [address], [place], [place] [address]    Report Status PENDING  Incomplete  Resp panel by RT-PCR (RSV, Flu A&B, Covid) Anterior Nasal Swab     Status: None   Collection Time: 01/23/24  8:01 PM   Specimen: Anterior Nasal Swab  Result Value Ref Range Status   SARS Coronavirus 2 by RT PCR NEGATIVE NEGATIVE Final    Comment: (NOTE) SARS-CoV-2 target nucleic acids are NOT DETECTED.  The SARS-CoV-2 RNA is generally detectable in upper respiratory specimens during the acute phase of infection. The lowest concentration of SARS-CoV-2 viral copies this assay can detect is 138 copies/mL. A negative result does not preclude SARS-Cov-2 infection and should not be used as the sole basis for treatment or other patient management decisions. A negative result may occur with  improper specimen collection/handling, submission of specimen other than nasopharyngeal swab, presence of viral mutation(s) within the areas targeted by this assay, and inadequate number of viral copies(<138 copies/mL). A negative result must be  combined with clinical observations, patient history, and epidemiological information. The expected result is Negative.  Fact Sheet for Patients:  BloggerCourse.com  Fact Sheet for Healthcare Providers:  SeriousBroker.it  This test is no t yet approved or cleared by the [place]  FDA and  has been authorized for detection and/or diagnosis of SARS-CoV-2 by FDA under an Emergency Use Authorization (EUA). This EUA will remain  in effect (meaning this test can be used) for the duration of the COVID-19 declaration under Section 564(b)(1) of the Act, 21 U.S.C.section 360bbb-3(b)(1), unless the authorization is terminated  or revoked sooner.       Influenza A by PCR NEGATIVE NEGATIVE Final   Influenza B by PCR NEGATIVE NEGATIVE Final    Comment: (NOTE) The Xpert Xpress SARS-CoV-2/FLU/RSV plus assay is intended as an aid in the diagnosis of influenza from Nasopharyngeal swab specimens and should not be used as a sole basis for treatment.  Nasal washings and aspirates are unacceptable for Xpert Xpress SARS-CoV-2/FLU/RSV testing.  Fact Sheet for Patients: BloggerCourse.com  Fact Sheet for Healthcare Providers: SeriousBroker.it  This test is not yet approved or cleared by the [place]  FDA and has been authorized for detection and/or diagnosis of SARS-CoV-2 by FDA under an Emergency Use Authorization (EUA). This EUA will remain in effect (meaning this test can be used) for the duration of the COVID-19 declaration under Section 564(b)(1) of the Act, 21 U.S.C. section 360bbb-3(b)(1), unless the authorization is terminated or revoked.     Resp Syncytial Virus by PCR NEGATIVE NEGATIVE Final    Comment: (NOTE) Fact Sheet for Patients: BloggerCourse.com  Fact Sheet for Healthcare Providers: SeriousBroker.it  This test is not yet  approved or cleared by the [place]  FDA and has been authorized for detection and/or diagnosis of SARS-CoV-2 by FDA under an Emergency Use Authorization (EUA). This EUA will remain in effect (meaning this test can be used) for the duration of the COVID-19 declaration under Section 564(b)(1) of the Act, 21 U.S.C. section 360bbb-3(b)(1), unless the authorization is terminated or revoked.  Performed at [facility], [address] [place], [place] [address]   Blood culture (routine x 2)     Status: None (Preliminary result)   Collection Time: 01/23/24  8:22 PM   Specimen: BLOOD  Result Value Ref Range Status   Specimen Description   Final    BLOOD LEFT ANTECUBITAL Performed at [facility], [address] [place], [place] [address]    Special Requests   Final    BOTTLES DRAWN AEROBIC AND ANAEROBIC Blood Culture adequate volume Performed at [facility], [address], [place], [place] [address]    Culture   Final    NO GROWTH < 12 HOURS Performed at [facility], [address] [place], [place] [address]    Report Status PENDING  Incomplete     Labs: BNP (last 3 results) No results for input(s): "BNP" in the last 8760 hours. Basic Metabolic Panel: Recent Labs  Lab 01/23/24 1950 01/24/24 0501  NA 139 137  K 3.9 3.6  CL 102 102  CO2 27 26  GLUCOSE 110* 125*  BUN 14 8  CREATININE 1.23 1.08  CALCIUM 10.5* 9.4   Liver Function Tests: Recent Labs  Lab 01/23/24 1950  AST 21  ALT 19  ALKPHOS 73  BILITOT 0.7  PROT 7.6  ALBUMIN  5.0   No results for input(s): "LIPASE", "AMYLASE" in the last 168 hours. No results for input(s): "AMMONIA" in the last 168 hours. CBC: Recent Labs  Lab 01/23/24 1950 01/24/24 0501  WBC 8.8 7.3  NEUTROABS 5.9 4.8  HGB 14.6 14.0  HCT 42.9 40.8  MCV 91.7 91.3  PLT 259 221   Cardiac Enzymes: No results for input(s): "CKTOTAL", "CKMB", "CKMBINDEX", "TROPONINI" in  the last 168 hours. BNP: Invalid input(s): "POCBNP" CBG: No results for input(s): "GLUCAP" in the last 168 hours. D-Dimer No results for input(s): "DDIMER" in the last 72 hours. Hgb A1c No results for input(s): "HGBA1C" in the last 72 hours. Lipid Profile No results for input(s): "CHOL", "HDL", "LDLCALC", "TRIG", "CHOLHDL", "LDLDIRECT" in the last 72 hours. Thyroid  function studies No results for input(s): "TSH", "T4TOTAL", "T3FREE", "THYROIDAB" in the last 72 hours.  Invalid input(s): "FREET3" Anemia work up No results for input(s): "VITAMINB12", "FOLATE", "FERRITIN", "TIBC", "IRON", "RETICCTPCT" in the last 72 hours. Urinalysis    Component Value Date/Time   COLORURINE YELLOW 02/21/2021 1325  APPEARANCEUR Clear 02/20/2023 0854   LABSPEC 1.024 02/21/2021 1325   PHURINE 5.0 02/21/2021 1325   GLUCOSEU Negative 02/20/2023 0854   HGBUR SMALL (A) 02/21/2021 1325   BILIRUBINUR Negative 02/20/2023 0854   KETONESUR NEGATIVE 02/21/2021 1325   PROTEINUR Negative 02/20/2023 0854   PROTEINUR NEGATIVE 02/21/2021 1325   NITRITE Negative 02/20/2023 0854   NITRITE NEGATIVE 02/21/2021 1325   LEUKOCYTESUR 1+ (A) 02/20/2023 0854   LEUKOCYTESUR NEGATIVE 02/21/2021 1325   Sepsis Labs Recent Labs  Lab 01/23/24 1950 01/24/24 0501  WBC 8.8 7.3   Microbiology Recent Results (from the past 240 hours)  Blood culture (routine x 2)     Status: None (Preliminary result)   Collection Time: 01/23/24  7:54 PM   Specimen: BLOOD  Result Value Ref Range Status   Specimen Description   Final    BLOOD RIGHT ANTECUBITAL Performed at [facility], [address] [place], [place] [address]    Special Requests   Final    Blood Culture adequate volume BOTTLES DRAWN AEROBIC AND ANAEROBIC Performed at [facility], [address] [place], [place] [address]    Culture   Final    NO GROWTH < 12 HOURS Performed at [facility], [address],  [place], [place] [address]    Report Status PENDING  Incomplete  Resp panel by RT-PCR (RSV, Flu A&B, Covid) Anterior Nasal Swab     Status: None   Collection Time: 01/23/24  8:01 PM   Specimen: Anterior Nasal Swab  Result Value Ref Range Status   SARS Coronavirus 2 by RT PCR NEGATIVE NEGATIVE Final    Comment: (NOTE) SARS-CoV-2 target nucleic acids are NOT DETECTED.  The SARS-CoV-2 RNA is generally detectable in upper respiratory specimens during the acute phase of infection. The lowest concentration of SARS-CoV-2 viral copies this assay can detect is 138 copies/mL. A negative result does not preclude SARS-Cov-2 infection and should not be used as the sole basis for treatment or other patient management decisions. A negative result may occur with  improper specimen collection/handling, submission of specimen other than nasopharyngeal swab, presence of viral mutation(s) within the areas targeted by this assay, and inadequate number of viral copies(<138 copies/mL). A negative result must be combined with clinical observations, patient history, and epidemiological information. The expected result is Negative.  Fact Sheet for Patients:  BloggerCourse.com  Fact Sheet for Healthcare Providers:  SeriousBroker.it  This test is no t yet approved or cleared by the [place]  FDA and  has been authorized for detection and/or diagnosis of SARS-CoV-2 by FDA under an Emergency Use Authorization (EUA). This EUA will remain  in effect (meaning this test can be used) for the duration of the COVID-19 declaration under Section 564(b)(1) of the Act, 21 U.S.C.section 360bbb-3(b)(1), unless the authorization is terminated  or revoked sooner.       Influenza A by PCR NEGATIVE NEGATIVE Final   Influenza B by PCR NEGATIVE NEGATIVE Final    Comment: (NOTE) The Xpert Xpress SARS-CoV-2/FLU/RSV plus assay is intended as an aid in the diagnosis of influenza  from Nasopharyngeal swab specimens and should not be used as a sole basis for treatment. Nasal washings and aspirates are unacceptable for Xpert Xpress SARS-CoV-2/FLU/RSV testing.  Fact Sheet for Patients: BloggerCourse.com  Fact Sheet for Healthcare Providers: SeriousBroker.it  This test is not yet approved or cleared by the [place]  FDA and has been authorized for detection and/or diagnosis of SARS-CoV-2 by FDA under an Emergency Use  Authorization (EUA). This EUA will remain in effect (meaning this test can be used) for the duration of the COVID-19 declaration under Section 564(b)(1) of the Act, 21 U.S.C. section 360bbb-3(b)(1), unless the authorization is terminated or revoked.     Resp Syncytial Virus by PCR NEGATIVE NEGATIVE Final    Comment: (NOTE) Fact Sheet for Patients: BloggerCourse.com  Fact Sheet for Healthcare Providers: SeriousBroker.it  This test is not yet approved or cleared by the [place]  FDA and has been authorized for detection and/or diagnosis of SARS-CoV-2 by FDA under an Emergency Use Authorization (EUA). This EUA will remain in effect (meaning this test can be used) for the duration of the COVID-19 declaration under Section 564(b)(1) of the Act, 21 U.S.C. section 360bbb-3(b)(1), unless the authorization is terminated or revoked.  Performed at [facility], [address] [place], [place] [address]   Blood culture (routine x 2)     Status: None (Preliminary result)   Collection Time: 01/23/24  8:22 PM   Specimen: BLOOD  Result Value Ref Range Status   Specimen Description   Final    BLOOD LEFT ANTECUBITAL Performed at [facility], [address], [place], [place] [address]    Special Requests   Final    BOTTLES DRAWN AEROBIC AND ANAEROBIC Blood Culture adequate volume Performed at [facility], [address], [place], [place] [address]    Culture   Final    NO GROWTH < 12 HOURS Performed at [facility], [address] [place], [place] [address]    Report Status PENDING  Incomplete     Time coordinating discharge: Over 30 minutes  SIGNED:   Haydee Lipa, DO [facility] 01/24/2024, 4:49 PM Pager   If 7PM-7AM, please contact night-coverage [email or web address]

## 2024-01-27 ENCOUNTER — Telehealth: Payer: Self-pay

## 2024-01-27 ENCOUNTER — Ambulatory Visit: Payer: Self-pay | Attending: Nurse Practitioner | Admitting: Nurse Practitioner

## 2024-01-27 ENCOUNTER — Encounter: Payer: Self-pay | Admitting: Nurse Practitioner

## 2024-01-27 ENCOUNTER — Other Ambulatory Visit: Payer: Self-pay

## 2024-01-27 VITALS — BP 134/81 | HR 61 | Resp 20 | Ht 71.0 in | Wt 210.6 lb

## 2024-01-27 DIAGNOSIS — E890 Postprocedural hypothyroidism: Secondary | ICD-10-CM

## 2024-01-27 DIAGNOSIS — I10 Essential (primary) hypertension: Secondary | ICD-10-CM

## 2024-01-27 DIAGNOSIS — K089 Disorder of teeth and supporting structures, unspecified: Secondary | ICD-10-CM

## 2024-01-27 MED ORDER — AMLODIPINE BESYLATE 5 MG PO TABS
5.0000 mg | ORAL_TABLET | Freq: Every day | ORAL | 1 refills | Status: DC
  Filled 2024-01-27: qty 90, 90d supply, fill #0

## 2024-01-27 MED ORDER — CHLORHEXIDINE GLUCONATE 0.12 % MT SOLN
15.0000 mL | Freq: Two times a day (BID) | OROMUCOSAL | 1 refills | Status: DC
  Filled 2024-01-27: qty 473, 16d supply, fill #0
  Filled 2024-03-02: qty 473, 16d supply, fill #1
  Filled 2024-03-15 – 2024-03-16 (×2): qty 473, 16d supply, fill #2
  Filled 2024-03-31: qty 473, 16d supply, fill #3
  Filled 2024-05-03: qty 473, 16d supply, fill #4

## 2024-01-27 NOTE — Progress Notes (Signed)
 Assessment & Plan:  [name] was seen today for medical management of chronic issues.  Diagnoses and all orders for this visit:  Primary hypertension -     amLODipine (NORVASC) 5 MG tablet; Take 1 tablet (5 mg total) by mouth daily. FOR BLOOD PRESSURE  Postablative hypothyroidism TSH PENDING Continue levothyroxine  as prescribed.   Poor dentition -     chlorhexidine  (PERIDEX ) 0.12 % solution; Use as directed 15 mLs in the mouth or throat 2 (two) times daily.    Patient has been counseled on age-appropriate routine health concerns for screening and prevention. These are reviewed and up-to-date. Referrals have been placed accordingly. Immunizations are up-to-date or declined.    Subjective:   Chief Complaint  Patient presents with   Medical Management of Chronic Issues    [name] 58 y.o. male presents to office today   Recently admitted to the hospital May 1 and discharged May 2 after being diagnosed with facial cellulitis secondary to poor dentition.  Blood pressure was also markedly elevated during his emergency room visit.  He was discharged home on oral antibiotics (Augmentin  4 days) along with oxycodone  and instructed to follow-up with a dentist in 1 to 2 weeks.  Today he notes significant improvement in left-sided facial swelling.   HTN BP Readings from Last 3 Encounters:  01/27/24 134/81  01/24/24 (!) 148/93  08/28/23 131/84     Hypothyroidism And his last visit with me in January he was instructed to return in 4 to 6 weeks to repeat his thyroid  level which at that time was 23.  He did not return and is here today for the first time since then to repeat his levels. Lab Results  Component Value Date   TSH 23.900 (H) 10/09/2023     Review of Systems  Constitutional:  Negative for fever, malaise/fatigue and weight loss.  HENT: Negative.  Negative for nosebleeds.   Eyes: Negative.  Negative for blurred vision, double vision and photophobia.  Respiratory:  Negative.  Negative for cough and shortness of breath.   Cardiovascular: Negative.  Negative for chest pain, palpitations and leg swelling.  Gastrointestinal: Negative.  Negative for heartburn, nausea and vomiting.  Musculoskeletal: Negative.  Negative for myalgias.  Neurological: Negative.  Negative for dizziness, focal weakness, seizures and headaches.  Psychiatric/Behavioral: Negative.  Negative for suicidal ideas.     Past Medical History:  Diagnosis Date   Thyroid  disease    hyper     Past Surgical History:  Procedure Laterality Date   FACIAL LACERATION REPAIR N/A 02/09/2021   Procedure: CLOSURE OF FACIAL LACERATION;  Surgeon: Damon Spice, MD;  [facility] OR;  Service: ENT;  Laterality: N/A;   KNEE ARTHROSCOPY WITH POSTERIOR CRUCIATE LIGAMENT (PCL) RECONSTRUCTION Right 05/10/2021   Procedure: KNEE ARTHROSCOPY WITH POSTERIOR CRUCIATE LIGAMENT (PCL) RECONSTRUCTION;  Surgeon: Damon Ahr, MD;  Location:  SURGERY CENTER;  Service: Orthopedics;  Laterality: Right;   ORIF ACETABULAR FRACTURE Right 02/10/2021   Procedure: OPEN REDUCTION INTERNAL FIXATION (ORIF) ACETABULAR FRACTURE;  Surgeon: Damon Pintos, MD;  Location: [facility] OR;  Service: Orthopedics;  Laterality: Right;   ORIF HUMERUS FRACTURE Left 02/10/2021   Procedure: OPEN REDUCTION INTERNAL FIXATION (ORIF) HUMERAL SHAFT FRACTURE;  Surgeon: Damon Pintos, MD;  Location: [facility] OR;  Service: Orthopedics;  Laterality: Left;   THYROID  SURGERY     radiation    No family history on file.  Social History Reviewed with no changes to be made today.   Outpatient Medications  Prior to Visit  Medication Sig Dispense Refill   levothyroxine  (SYNTHROID ) 137 MCG tablet Take 1 tablet (137 mcg total) by mouth daily before breakfast. Per PCP, pt needs updated TSH/labs on this dose. Please make labs appt. 30 tablet 0   amoxicillin -clavulanate (AUGMENTIN ) 875-125 MG tablet Take 1 tablet by mouth 2 (two) times daily for 4 days.  (Patient not taking: Reported on 01/27/2024) 8 tablet 0   HYDROcodone -acetaminophen  (HYCET) 7.5-325 mg/15 ml solution Take 15 mLs by mouth 4 (four) times daily as needed for moderate pain (pain score 4-6). (Patient not taking: Reported on 01/27/2024) 120 mL 0   No facility-administered medications prior to visit.    Allergies  Allergen Reactions   Shellfish Allergy Swelling    Per patient report 5/2   Lactose Intolerance (Gi)     Unknown rxn.   Pork-Derived Products     Religious.       Objective:    BP 134/81 (BP Location: Left Arm, Patient Position: Sitting, Cuff Size: Large)   Pulse 61   Resp 20   Ht 5\' 11"  (1.803 m)   Wt 210 lb 9.6 oz (95.5 kg)   SpO2 100%   BMI 29.37 kg/m  Wt Readings from Last 3 Encounters:  01/27/24 210 lb 9.6 oz (95.5 kg)  01/23/24 220 lb (99.8 kg)  08/28/23 221 lb 9.6 oz (100.5 kg)    Physical Exam       Patient has been counseled extensively about nutrition and exercise as well as the importance of adherence with medications and regular follow-up. The patient was given clear instructions to go to ER or return to medical center if symptoms don't improve, worsen or new problems develop. The patient verbalized understanding.   Follow-up: Return in about 4 weeks (around 02/24/2024) for BP recheck.   Damon Dean, FNP-BC [facility] and [facility] [place], [place] [phone]   01/27/2024, 10:22 AM

## 2024-01-27 NOTE — Telephone Encounter (Signed)
Patient has appointment with PCP today 

## 2024-01-28 ENCOUNTER — Other Ambulatory Visit: Payer: Self-pay | Admitting: Nurse Practitioner

## 2024-01-28 ENCOUNTER — Encounter: Payer: Self-pay | Admitting: Nurse Practitioner

## 2024-01-28 ENCOUNTER — Other Ambulatory Visit: Payer: Self-pay

## 2024-01-28 DIAGNOSIS — E890 Postprocedural hypothyroidism: Secondary | ICD-10-CM

## 2024-01-28 LAB — CULTURE, BLOOD (ROUTINE X 2)
Culture: NO GROWTH
Culture: NO GROWTH
Special Requests: ADEQUATE
Special Requests: ADEQUATE

## 2024-01-28 LAB — THYROID PANEL WITH TSH
Free Thyroxine Index: 2.6 (ref 1.2–4.9)
T3 Uptake Ratio: 33 % (ref 24–39)
T4, Total: 7.9 ug/dL (ref 4.5–12.0)
TSH: 3.23 u[IU]/mL (ref 0.450–4.500)

## 2024-01-28 MED ORDER — LEVOTHYROXINE SODIUM 137 MCG PO TABS
137.0000 ug | ORAL_TABLET | Freq: Every day | ORAL | 1 refills | Status: DC
  Filled 2024-01-28: qty 90, 90d supply, fill #0
  Filled 2024-02-03: qty 30, 30d supply, fill #0
  Filled 2024-03-02: qty 30, 30d supply, fill #1
  Filled 2024-04-06: qty 30, 30d supply, fill #2
  Filled 2024-05-03: qty 30, 30d supply, fill #3

## 2024-02-03 ENCOUNTER — Other Ambulatory Visit: Payer: Self-pay

## 2024-02-24 ENCOUNTER — Encounter: Payer: Self-pay | Admitting: Nurse Practitioner

## 2024-02-24 ENCOUNTER — Ambulatory Visit: Payer: Self-pay | Attending: Nurse Practitioner | Admitting: Nurse Practitioner

## 2024-02-24 ENCOUNTER — Other Ambulatory Visit: Payer: Self-pay

## 2024-02-24 VITALS — BP 146/89 | HR 69 | Resp 20 | Ht 71.0 in | Wt 210.0 lb

## 2024-02-24 DIAGNOSIS — Z1211 Encounter for screening for malignant neoplasm of colon: Secondary | ICD-10-CM

## 2024-02-24 DIAGNOSIS — I10 Essential (primary) hypertension: Secondary | ICD-10-CM

## 2024-02-24 MED ORDER — AMLODIPINE BESYLATE 10 MG PO TABS
10.0000 mg | ORAL_TABLET | Freq: Every day | ORAL | 1 refills | Status: DC
  Filled 2024-02-24: qty 90, 90d supply, fill #0
  Filled 2024-03-31 – 2024-05-03 (×2): qty 90, 90d supply, fill #1

## 2024-02-24 NOTE — Progress Notes (Signed)
 Assessment & Plan:   [name] was seen today for hypertension.  Diagnoses and all orders for this visit:  Primary hypertension DOSE CHANGE: increase amlodipine  from 5 mg to 10mg  -     amLODipine  (NORVASC ) 10 MG tablet; Take 1 tablet (10 mg total) by mouth daily. FOR BLOOD PRESSURE Continue all antihypertensives as prescribed.  Reminded to bring in blood pressure log for follow  up appointment.  RECOMMENDATIONS: DASH/Mediterranean Diets are healthier choices for HTN.    Colon cancer screening -     Fecal occult blood, imunochemical([facility])    Patient has been counseled on age-appropriate routine health concerns for screening and prevention. These are reviewed and up-to-date. Referrals have been placed accordingly. Immunizations are up-to-date or declined.    Subjective:   Chief Complaint  Patient presents with   Hypertension    [name] 58 y.o. male presents to office today for follow up to HTN   At his last office visit we started amlodipine  5 mg due to elevated blood pressures. Today his blood pressure remains elevated despite medication adherence. At this time we will increase his current dose.  BP Readings from Last 3 Encounters:  02/24/24 (!) 146/89  01/27/24 134/81  01/24/24 (!) 148/93     Review of Systems  Constitutional:  Negative for fever, malaise/fatigue and weight loss.  HENT: Negative.  Negative for nosebleeds.   Eyes: Negative.  Negative for blurred vision, double vision and photophobia.  Respiratory: Negative.  Negative for cough and shortness of breath.   Cardiovascular: Negative.  Negative for chest pain, palpitations and leg swelling.  Gastrointestinal: Negative.  Negative for heartburn, nausea and vomiting.  Musculoskeletal: Negative.  Negative for myalgias.  Neurological: Negative.  Negative for dizziness, focal weakness, seizures and headaches.  Psychiatric/Behavioral: Negative.  Negative for suicidal ideas.     Past Medical  History:  Diagnosis Date   Thyroid  disease    hyper     Past Surgical History:  Procedure Laterality Date   FACIAL LACERATION REPAIR N/A 02/09/2021   Procedure: CLOSURE OF FACIAL LACERATION;  Surgeon: Mearl Spice, MD;  Location: [facility] OR;  Service: ENT;  Laterality: N/A;   KNEE ARTHROSCOPY WITH POSTERIOR CRUCIATE LIGAMENT (PCL) RECONSTRUCTION Right 05/10/2021   Procedure: KNEE ARTHROSCOPY WITH POSTERIOR CRUCIATE LIGAMENT (PCL) RECONSTRUCTION;  Surgeon: Micheline Ahr, MD;  Location: [facility];  Service: Orthopedics;  Laterality: Right;   ORIF ACETABULAR FRACTURE Right 02/10/2021   Procedure: OPEN REDUCTION INTERNAL FIXATION (ORIF) ACETABULAR FRACTURE;  Surgeon: Laneta Pintos, MD;  Location: [facility] OR;  Service: Orthopedics;  Laterality: Right;   ORIF HUMERUS FRACTURE Left 02/10/2021   Procedure: OPEN REDUCTION INTERNAL FIXATION (ORIF) HUMERAL SHAFT FRACTURE;  Surgeon: Laneta Pintos, MD;  Location: [facility] OR;  Service: Orthopedics;  Laterality: Left;   THYROID  SURGERY     radiation    History reviewed. No pertinent family history.  Social History Reviewed with no changes to be made today.   Outpatient Medications Prior to Visit  Medication Sig Dispense Refill   chlorhexidine  (PERIDEX ) 0.12 % solution Use as directed 15 mLs in the mouth or throat 2 (two) times daily. 1000 mL 1   levothyroxine  (SYNTHROID ) 137 MCG tablet Take 1 tablet (137 mcg total) by mouth daily before breakfast. 90 tablet 1   amLODipine  (NORVASC ) 5 MG tablet Take 1 tablet (5 mg total) by mouth daily. FOR BLOOD PRESSURE 90 tablet 1   No facility-administered medications prior to visit.    Allergies  Allergen  Reactions   Shellfish Allergy Swelling    Per patient report 5/2   Lactose Intolerance (Gi)     Unknown rxn.   Pork-Derived Products     Religious.       Objective:    BP (!) 146/89 (Cuff Size: Normal)   Pulse 69   Resp 20   Ht 5\' 11"  (1.803 m)   Wt 210 lb (95.3 kg)   SpO2 100%   BMI  29.29 kg/m  Wt Readings from Last 3 Encounters:  02/24/24 210 lb (95.3 kg)  01/27/24 210 lb 9.6 oz (95.5 kg)  01/23/24 220 lb (99.8 kg)    Physical Exam Vitals and nursing note reviewed.  Constitutional:      Appearance: He is well-developed.  HENT:     Head: Normocephalic and atraumatic.  Cardiovascular:     Rate and Rhythm: Normal rate and regular rhythm.     Heart sounds: Normal heart sounds. No murmur heard.    No friction rub. No gallop.  Pulmonary:     Effort: Pulmonary effort is normal. No tachypnea or respiratory distress.     Breath sounds: Normal breath sounds. No decreased breath sounds, wheezing, rhonchi or rales.  Chest:     Chest wall: No tenderness.  Abdominal:     General: Bowel sounds are normal.     Palpations: Abdomen is soft.  Musculoskeletal:        General: Normal range of motion.     Cervical back: Normal range of motion.  Skin:    General: Skin is warm and dry.  Neurological:     Mental Status: He is alert and oriented to person, place, and time.     Coordination: Coordination normal.  Psychiatric:        Behavior: Behavior normal. Behavior is cooperative.        Thought Content: Thought content normal.        Judgment: Judgment normal.          Patient has been counseled extensively about nutrition and exercise as well as the importance of adherence with medications and regular follow-up. The patient was given clear instructions to go to ER or return to medical center if symptoms don't improve, worsen or new problems develop. The patient verbalized understanding.   Follow-up: Return in about 3 months (around 05/26/2024) for BP recheck.   [name], FNP-BC [facility] and [facility] [place], [place] [phone]   02/24/2024, 10:15 AM

## 2024-02-27 ENCOUNTER — Other Ambulatory Visit: Payer: Self-pay

## 2024-02-28 LAB — FECAL OCCULT BLOOD, IMMUNOCHEMICAL: Fecal Occult Bld: NEGATIVE

## 2024-03-01 ENCOUNTER — Ambulatory Visit: Payer: Self-pay | Admitting: Nurse Practitioner

## 2024-03-02 ENCOUNTER — Other Ambulatory Visit: Payer: Self-pay

## 2024-03-04 ENCOUNTER — Other Ambulatory Visit: Payer: Self-pay

## 2024-03-16 ENCOUNTER — Other Ambulatory Visit: Payer: Self-pay

## 2024-03-19 ENCOUNTER — Other Ambulatory Visit: Payer: Self-pay

## 2024-03-31 ENCOUNTER — Other Ambulatory Visit: Payer: Self-pay

## 2024-04-03 ENCOUNTER — Other Ambulatory Visit: Payer: Self-pay

## 2024-04-06 ENCOUNTER — Other Ambulatory Visit: Payer: Self-pay

## 2024-05-04 ENCOUNTER — Other Ambulatory Visit: Payer: Self-pay

## 2024-05-04 ENCOUNTER — Other Ambulatory Visit: Payer: Self-pay | Admitting: Pharmacist

## 2024-05-04 DIAGNOSIS — K089 Disorder of teeth and supporting structures, unspecified: Secondary | ICD-10-CM

## 2024-05-04 MED ORDER — CHLORHEXIDINE GLUCONATE 0.12 % MT SOLN
15.0000 mL | Freq: Two times a day (BID) | OROMUCOSAL | 1 refills | Status: DC
  Filled 2024-05-04: qty 473, 16d supply, fill #0
  Filled 2024-05-19: qty 473, 16d supply, fill #1

## 2024-05-08 ENCOUNTER — Other Ambulatory Visit: Payer: Self-pay

## 2024-05-19 ENCOUNTER — Other Ambulatory Visit: Payer: Self-pay

## 2024-05-22 ENCOUNTER — Telehealth: Payer: Self-pay | Admitting: Nurse Practitioner

## 2024-05-22 NOTE — Telephone Encounter (Signed)
 Pt unconfirmed appt 8/29 lvm

## 2024-05-26 ENCOUNTER — Encounter: Payer: Self-pay | Admitting: Nurse Practitioner

## 2024-05-26 ENCOUNTER — Other Ambulatory Visit: Payer: Self-pay

## 2024-05-26 ENCOUNTER — Ambulatory Visit: Payer: Self-pay | Attending: Nurse Practitioner | Admitting: Nurse Practitioner

## 2024-05-26 VITALS — BP 135/81 | HR 70 | Resp 19 | Ht 71.0 in | Wt 210.2 lb

## 2024-05-26 DIAGNOSIS — I10 Essential (primary) hypertension: Secondary | ICD-10-CM

## 2024-05-26 DIAGNOSIS — Z7901 Long term (current) use of anticoagulants: Secondary | ICD-10-CM

## 2024-05-26 DIAGNOSIS — Z79890 Hormone replacement therapy (postmenopausal): Secondary | ICD-10-CM

## 2024-05-26 DIAGNOSIS — H5789 Other specified disorders of eye and adnexa: Secondary | ICD-10-CM

## 2024-05-26 DIAGNOSIS — E890 Postprocedural hypothyroidism: Secondary | ICD-10-CM

## 2024-05-26 MED ORDER — AMLODIPINE BESYLATE 10 MG PO TABS
10.0000 mg | ORAL_TABLET | Freq: Every day | ORAL | 1 refills | Status: DC
  Filled 2024-05-26 – 2024-08-21 (×3): qty 90, 90d supply, fill #0

## 2024-05-26 MED ORDER — LEVOTHYROXINE SODIUM 137 MCG PO TABS
137.0000 ug | ORAL_TABLET | Freq: Every day | ORAL | 1 refills | Status: DC
  Filled 2024-05-26: qty 90, 90d supply, fill #0
  Filled 2024-08-20: qty 90, 90d supply, fill #1

## 2024-05-26 NOTE — Progress Notes (Signed)
 Assessment & Plan:  [name] was seen today for hypertension.  Diagnoses and all orders for this visit:  Primary hypertension -     amLODipine  (NORVASC ) 10 MG tablet; Take 1 tablet (10 mg total) by mouth daily. FOR BLOOD PRESSURE -     CMP14+EGFR Stage 1 hypertension, improved since last visit. Goal: 120/70. Weight reduction ongoing to avoid additional medications. - Continue amlodipine . - Encourage weight loss. - Follow up in four months.   Postablative hypothyroidism -     levothyroxine  (SYNTHROID ) 137 MCG tablet; Take 1 tablet (137 mcg total) by mouth daily before breakfast. -     CMP14+EGFR -     Thyroid  Panel With TSH Thyroid  function well-managed. Recheck to ensure normal levels. - Recheck thyroid  function tests. - If normal, recheck in four months. - Continue current thyroid  medication.  Left eye redness Intermittent redness without burning. Possible dry eyes, rule out vascular issues. - Refer to ophthalmologist for evaluation. - Provide list of ophthalmologists with payment plans.      Patient has been counseled on age-appropriate routine health concerns for screening and prevention. These are reviewed and up-to-date. Referrals have been placed accordingly. Immunizations are up-to-date or declined.    Subjective:   Chief Complaint  Patient presents with   Hypertension     History of Present Illness He is a 58 year old male with hypertension and thyroid  issues who presents for blood pressure management and medication refills.   HTN He is working on reducing his weight to help lower his blood pressure. He is currently taking amlodipine  10mg  daily.  BP Readings from Last 3 Encounters:  05/26/24 135/81  02/24/24 (!) 146/89  01/27/24 134/81     Hypothyroidism Normal thyroid  level. No symptoms of hypo or thyroid  disease.  He is taking levothyroxine  137 mg daily as prescribed. Lab Results  Component Value Date   TSH 3.230 01/27/2024   T4TOTAL 7.9  01/27/2024     He has noticed redness in his left eye for a couple of weeks, particularly in the morning, which sometimes burns. He uses eye drops to alleviate the redness. No burning or visual changes noted in the left eye currently.   Review of Systems  Constitutional:  Negative for fever, malaise/fatigue and weight loss.  HENT: Negative.  Negative for nosebleeds.   Eyes:  Positive for redness. Negative for blurred vision, double vision, photophobia, pain and discharge.  Respiratory: Negative.  Negative for cough and shortness of breath.   Cardiovascular: Negative.  Negative for chest pain, palpitations and leg swelling.  Gastrointestinal: Negative.  Negative for heartburn, nausea and vomiting.  Musculoskeletal: Negative.  Negative for myalgias.  Neurological: Negative.  Negative for dizziness, focal weakness, seizures and headaches.  Psychiatric/Behavioral: Negative.  Negative for suicidal ideas.     Past Medical History:  Diagnosis Date   Thyroid  disease    hyper     Past Surgical History:  Procedure Laterality Date   FACIAL LACERATION REPAIR N/A 02/09/2021   Procedure: CLOSURE OF FACIAL LACERATION;  Surgeon: Meliton Elsie HERO, MD;  Location: [facility] OR;  Service: ENT;  Laterality: N/A;   KNEE ARTHROSCOPY WITH POSTERIOR CRUCIATE LIGAMENT (PCL) RECONSTRUCTION Right 05/10/2021   Procedure: KNEE ARTHROSCOPY WITH POSTERIOR CRUCIATE LIGAMENT (PCL) RECONSTRUCTION;  Surgeon: Cristy Bonner DASEN, MD;  Location: [facility];  Service: Orthopedics;  Laterality: Right;   ORIF ACETABULAR FRACTURE Right 02/10/2021   Procedure: OPEN REDUCTION INTERNAL FIXATION (ORIF) ACETABULAR FRACTURE;  Surgeon: Kendal Franky SQUIBB, MD;  Location:  [facility] OR;  Service: Orthopedics;  Laterality: Right;   ORIF HUMERUS FRACTURE Left 02/10/2021   Procedure: OPEN REDUCTION INTERNAL FIXATION (ORIF) HUMERAL SHAFT FRACTURE;  Surgeon: Kendal Franky SQUIBB, MD;  Location: [facility] OR;  Service: Orthopedics;  Laterality: Left;   THYROID   SURGERY     radiation    No family history on file.  Social History Reviewed with no changes to be made today.   Outpatient Medications Prior to Visit  Medication Sig Dispense Refill   chlorhexidine  (PERIDEX ) 0.12 % solution Use as directed 15 mLs in the mouth or throat 2 (two) times daily. 473 mL 1   amLODipine  (NORVASC ) 10 MG tablet Take 1 tablet (10 mg total) by mouth daily. FOR BLOOD PRESSURE 90 tablet 1   levothyroxine  (SYNTHROID ) 137 MCG tablet Take 1 tablet (137 mcg total) by mouth daily before breakfast. 90 tablet 1   No facility-administered medications prior to visit.    Allergies  Allergen Reactions   Shellfish Allergy Swelling    Per patient report 5/2   Lactose Intolerance (Gi)     Unknown rxn.   Pork-Derived Products     Religious.       Objective:    BP 135/81 (BP Location: Left Arm, Patient Position: Sitting, Cuff Size: Normal)   Pulse 70   Resp 19   Ht 5' 11 (1.803 m)   Wt 210 lb 3.2 oz (95.3 kg)   SpO2 100%   BMI 29.32 kg/m  Wt Readings from Last 3 Encounters:  05/26/24 210 lb 3.2 oz (95.3 kg)  02/24/24 210 lb (95.3 kg)  01/27/24 210 lb 9.6 oz (95.5 kg)    Physical Exam Vitals and nursing note reviewed.  Constitutional:      Appearance: He is well-developed.  HENT:     Head: Normocephalic and atraumatic.  Eyes:     Conjunctiva/sclera:     Left eye: Left conjunctiva is injected.  Cardiovascular:     Rate and Rhythm: Normal rate and regular rhythm.     Heart sounds: Normal heart sounds. No murmur heard.    No friction rub. No gallop.  Pulmonary:     Effort: Pulmonary effort is normal. No tachypnea or respiratory distress.     Breath sounds: Normal breath sounds. No decreased breath sounds, wheezing, rhonchi or rales.  Chest:     Chest wall: No tenderness.  Abdominal:     General: Bowel sounds are normal.     Palpations: Abdomen is soft.  Musculoskeletal:        General: Normal range of motion.     Cervical back: Normal range of  motion.  Skin:    General: Skin is warm and dry.  Neurological:     Mental Status: He is alert and oriented to person, place, and time.     Coordination: Coordination normal.  Psychiatric:        Behavior: Behavior normal. Behavior is cooperative.        Thought Content: Thought content normal.        Judgment: Judgment normal.          Patient has been counseled extensively about nutrition and exercise as well as the importance of adherence with medications and regular follow-up. The patient was given clear instructions to go to ER or return to medical center if symptoms don't improve, worsen or new problems develop. The patient verbalized understanding.   Follow-up: Return in about 4 months (around 09/25/2024).   Haze LELON Servant, FNP-BC [facility]  [facility] [place], [place] [phone]   05/26/2024, 10:26 AM

## 2024-05-27 LAB — CMP14+EGFR
ALT: 18 IU/L (ref 0–44)
AST: 20 IU/L (ref 0–40)
Albumin: 4.7 g/dL (ref 3.8–4.9)
Alkaline Phosphatase: 62 IU/L (ref 44–121)
BUN/Creatinine Ratio: 15 (ref 9–20)
BUN: 17 mg/dL (ref 6–24)
Bilirubin Total: 0.4 mg/dL (ref 0.0–1.2)
CO2: 22 mmol/L (ref 20–29)
Calcium: 10.6 mg/dL — ABNORMAL HIGH (ref 8.7–10.2)
Chloride: 100 mmol/L (ref 96–106)
Creatinine, Ser: 1.14 mg/dL (ref 0.76–1.27)
Globulin, Total: 2.6 g/dL (ref 1.5–4.5)
Glucose: 93 mg/dL (ref 70–99)
Potassium: 4.8 mmol/L (ref 3.5–5.2)
Sodium: 139 mmol/L (ref 134–144)
Total Protein: 7.3 g/dL (ref 6.0–8.5)
eGFR: 75 mL/min/1.73 (ref >59)

## 2024-05-27 LAB — THYROID PANEL WITH TSH
Free Thyroxine Index: 2.1 (ref 1.2–4.9)
T3 Uptake Ratio: 29 % (ref 24–39)
T4, Total: 7.3 ug/dL (ref 4.5–12.0)
TSH: 0.719 u[IU]/mL (ref 0.450–4.500)

## 2024-05-31 ENCOUNTER — Ambulatory Visit: Payer: Self-pay | Admitting: Nurse Practitioner

## 2024-06-01 ENCOUNTER — Other Ambulatory Visit: Payer: Self-pay

## 2024-08-20 ENCOUNTER — Other Ambulatory Visit: Payer: Self-pay | Admitting: Family Medicine

## 2024-08-20 DIAGNOSIS — K089 Disorder of teeth and supporting structures, unspecified: Secondary | ICD-10-CM

## 2024-08-21 ENCOUNTER — Other Ambulatory Visit: Payer: Self-pay

## 2024-08-23 MED ORDER — CHLORHEXIDINE GLUCONATE 0.12 % MT SOLN
15.0000 mL | Freq: Two times a day (BID) | OROMUCOSAL | 1 refills | Status: AC
  Filled 2024-08-23: qty 473, 16d supply, fill #0

## 2024-08-24 ENCOUNTER — Other Ambulatory Visit: Payer: Self-pay

## 2024-09-23 ENCOUNTER — Telehealth: Payer: Self-pay | Admitting: Nurse Practitioner

## 2024-09-23 NOTE — Telephone Encounter (Signed)
 Contacted pt left vm to confirmed appt

## 2024-09-25 ENCOUNTER — Encounter: Payer: Self-pay | Admitting: Nurse Practitioner

## 2024-09-25 ENCOUNTER — Ambulatory Visit: Payer: Self-pay | Attending: Nurse Practitioner | Admitting: Nurse Practitioner

## 2024-09-25 ENCOUNTER — Other Ambulatory Visit: Payer: Self-pay

## 2024-09-25 VITALS — BP 156/91 | HR 87 | Ht 71.0 in | Wt 217.2 lb

## 2024-09-25 DIAGNOSIS — I10 Essential (primary) hypertension: Secondary | ICD-10-CM

## 2024-09-25 DIAGNOSIS — E890 Postprocedural hypothyroidism: Secondary | ICD-10-CM

## 2024-09-25 DIAGNOSIS — E7800 Pure hypercholesterolemia, unspecified: Secondary | ICD-10-CM

## 2024-09-25 MED ORDER — LEVOTHYROXINE SODIUM 137 MCG PO TABS
137.0000 ug | ORAL_TABLET | Freq: Every day | ORAL | 1 refills | Status: AC
  Filled 2024-09-25: qty 90, 90d supply, fill #0

## 2024-09-25 MED ORDER — AMLODIPINE BESYLATE 10 MG PO TABS
10.0000 mg | ORAL_TABLET | Freq: Every day | ORAL | 1 refills | Status: AC
  Filled 2024-09-25: qty 90, 90d supply, fill #0

## 2024-09-25 NOTE — Progress Notes (Signed)
 "  Assessment & Plan:  Theus was seen today for hypertension.  Diagnoses and all orders for this visit:  Primary hypertension -     amLODipine  (NORVASC ) 10 MG tablet; Take 1 tablet (10 mg total) by mouth daily. FOR BLOOD PRESSURE -     CMP14+EGFR; Future Continue all antihypertensives as prescribed.  Reminded to bring in blood pressure log for follow  up appointment.  RECOMMENDATIONS: DASH/Mediterranean Diets are healthier choices for HTN.    Postablative hypothyroidism -     levothyroxine  (SYNTHROID ) 137 MCG tablet; Take 1 tablet (137 mcg total) by mouth daily before breakfast. -     Thyroid  Panel With TSH; Future  Hypercholesterolemia -     Lipid panel; Future    Patient has been counseled on age-appropriate routine health concerns for screening and prevention. These are reviewed and up-to-date. Referrals have been placed accordingly. Immunizations are up-to-date or declined.    Subjective:   Chief Complaint  Patient presents with   Hypertension    [name] 59 y.o. male presents to office today for follow up to HTN.   He has a past medical history of Thyroid  disease, HTN, HPL  HTN Blood pressure elevated today. He is taking amlodipine  10 mg daily as prescribed. Weight is up almost 10 lbs today. He also had cake and tea prior to his appointment this morning.  BP Readings from Last 3 Encounters:  09/25/24 (!) 156/91  05/26/24 135/81  02/24/24 (!) 146/89     He requested a sleep study today but is uninsured. He states he snores and has a hard time sleeping at night sometimes. He does not experience any apneic episodes, excessive daytime sleepiness, morning headaches or daily fatigue.    Hypothyroidism Normal thyroid  levels. He is taking levothyroxine  137 mg daily as prescribed.  Lab Results  Component Value Date   TSH 0.719 05/26/2024   T4TOTAL 7.3 05/26/2024    Review of Systems  Constitutional:  Negative for fever, malaise/fatigue and weight loss.   HENT: Negative.  Negative for nosebleeds.   Eyes: Negative.  Negative for blurred vision, double vision and photophobia.  Respiratory: Negative.  Negative for cough and shortness of breath.   Cardiovascular: Negative.  Negative for chest pain, palpitations and leg swelling.  Gastrointestinal: Negative.  Negative for heartburn, nausea and vomiting.  Musculoskeletal: Negative.  Negative for myalgias.  Neurological: Negative.  Negative for dizziness, focal weakness, seizures and headaches.  Psychiatric/Behavioral: Negative.  Negative for suicidal ideas.     Past Medical History:  Diagnosis Date   Thyroid  disease    hyper     Past Surgical History:  Procedure Laterality Date   FACIAL LACERATION REPAIR N/A 02/09/2021   Procedure: CLOSURE OF FACIAL LACERATION;  Surgeon: Meliton Elsie HERO, MD;  Location: [facility] OR;  Service: ENT;  Laterality: N/A;   KNEE ARTHROSCOPY WITH POSTERIOR CRUCIATE LIGAMENT (PCL) RECONSTRUCTION Right 05/10/2021   Procedure: KNEE ARTHROSCOPY WITH POSTERIOR CRUCIATE LIGAMENT (PCL) RECONSTRUCTION;  Surgeon: Cristy Bonner DASEN, MD;  Location: [facility];  Service: Orthopedics;  Laterality: Right;   ORIF ACETABULAR FRACTURE Right 02/10/2021   Procedure: OPEN REDUCTION INTERNAL FIXATION (ORIF) ACETABULAR FRACTURE;  Surgeon: Kendal Franky SQUIBB, MD;  Location: [facility] OR;  Service: Orthopedics;  Laterality: Right;   ORIF HUMERUS FRACTURE Left 02/10/2021   Procedure: OPEN REDUCTION INTERNAL FIXATION (ORIF) HUMERAL SHAFT FRACTURE;  Surgeon: Kendal Franky SQUIBB, MD;  Location: [facility] OR;  Service: Orthopedics;  Laterality: Left;   THYROID  SURGERY  radiation    No family history on file.  Social History Reviewed with no changes to be made today.   Outpatient Medications Prior to Visit  Medication Sig Dispense Refill   chlorhexidine  (PERIDEX ) 0.12 % solution Use as directed 15 mLs in the mouth or throat 2 (two) times daily. 473 mL 1   amLODipine  (NORVASC ) 10 MG tablet Take 1 tablet  (10 mg total) by mouth daily. FOR BLOOD PRESSURE 90 tablet 1   levothyroxine  (SYNTHROID ) 137 MCG tablet Take 1 tablet (137 mcg total) by mouth daily before breakfast. 90 tablet 1   No facility-administered medications prior to visit.    Allergies[1]     Objective:    BP (!) 156/91 (BP Location: Left Arm, Patient Position: Sitting, Cuff Size: Large)   Pulse 87   Ht 5' 11 (1.803 m)   Wt 217 lb 3.2 oz (98.5 kg)   SpO2 100%   BMI 30.29 kg/m  Wt Readings from Last 3 Encounters:  09/25/24 217 lb 3.2 oz (98.5 kg)  05/26/24 210 lb 3.2 oz (95.3 kg)  02/24/24 210 lb (95.3 kg)    Physical Exam Vitals and nursing note reviewed.  Constitutional:      Appearance: He is well-developed.  HENT:     Head: Normocephalic and atraumatic.  Cardiovascular:     Rate and Rhythm: Normal rate and regular rhythm.     Heart sounds: Normal heart sounds. No murmur heard.    No friction rub. No gallop.  Pulmonary:     Effort: Pulmonary effort is normal. No tachypnea or respiratory distress.     Breath sounds: Normal breath sounds. No decreased breath sounds, wheezing, rhonchi or rales.  Chest:     Chest wall: No tenderness.  Abdominal:     General: Bowel sounds are normal.     Palpations: Abdomen is soft.  Musculoskeletal:        General: Normal range of motion.     Cervical back: Normal range of motion.  Skin:    General: Skin is warm and dry.  Neurological:     Mental Status: He is alert and oriented to person, place, and time.     Coordination: Coordination normal.  Psychiatric:        Behavior: Behavior normal. Behavior is cooperative.        Thought Content: Thought content normal.        Judgment: Judgment normal.          Patient has been counseled extensively about nutrition and exercise as well as the importance of adherence with medications and regular follow-up. The patient was given clear instructions to go to ER or return to medical center if symptoms don't improve, worsen  or new problems develop. The patient verbalized understanding.   Follow-up: Return in about 3 months (around 12/24/2024).   Haze LELON Servant, FNP-BC [facility] and [facility] [place], [place] [phone]   09/25/2024, 9:45 AM    [1]  Allergies Allergen Reactions   Shellfish Allergy Swelling    Per patient report 5/2   Lactose Intolerance (Gi)     Unknown rxn.   Porcine (Pork) Protein-Containing Drug Products     Religious.   "

## 2024-09-25 NOTE — Patient Instructions (Signed)
 Ashwaghanda Melatonin Sleep tea time

## 2024-10-02 ENCOUNTER — Ambulatory Visit: Payer: Self-pay | Attending: Family Medicine

## 2024-10-02 DIAGNOSIS — E7800 Pure hypercholesterolemia, unspecified: Secondary | ICD-10-CM

## 2024-10-02 DIAGNOSIS — I10 Essential (primary) hypertension: Secondary | ICD-10-CM

## 2024-10-02 DIAGNOSIS — E890 Postprocedural hypothyroidism: Secondary | ICD-10-CM

## 2024-10-03 LAB — THYROID PANEL WITH TSH
Free Thyroxine Index: 3.1 (ref 1.2–4.9)
T3 Uptake Ratio: 36 % (ref 24–39)
T4, Total: 8.7 ug/dL (ref 4.5–12.0)
TSH: 3.82 u[IU]/mL (ref 0.450–4.500)

## 2024-10-03 LAB — CMP14+EGFR
ALT: 19 IU/L (ref 0–44)
AST: 19 IU/L (ref 0–40)
Albumin: 4.6 g/dL (ref 3.8–4.9)
Alkaline Phosphatase: 68 IU/L (ref 47–123)
BUN/Creatinine Ratio: 13 (ref 9–20)
BUN: 16 mg/dL (ref 6–24)
Bilirubin Total: 0.5 mg/dL (ref 0.0–1.2)
CO2: 25 mmol/L (ref 20–29)
Calcium: 10.1 mg/dL (ref 8.7–10.2)
Chloride: 101 mmol/L (ref 96–106)
Creatinine, Ser: 1.27 mg/dL (ref 0.76–1.27)
Globulin, Total: 2.5 g/dL (ref 1.5–4.5)
Glucose: 113 mg/dL — ABNORMAL HIGH (ref 70–99)
Potassium: 4.8 mmol/L (ref 3.5–5.2)
Sodium: 139 mmol/L (ref 134–144)
Total Protein: 7.1 g/dL (ref 6.0–8.5)
eGFR: 65 mL/min/1.73

## 2024-10-03 LAB — LIPID PANEL
Chol/HDL Ratio: 3.5 ratio (ref 0.0–5.0)
Cholesterol, Total: 237 mg/dL — ABNORMAL HIGH (ref 100–199)
HDL: 68 mg/dL
LDL Chol Calc (NIH): 157 mg/dL — ABNORMAL HIGH (ref 0–99)
Triglycerides: 70 mg/dL (ref 0–149)
VLDL Cholesterol Cal: 12 mg/dL (ref 5–40)

## 2024-10-05 ENCOUNTER — Ambulatory Visit: Payer: Self-pay | Admitting: Nurse Practitioner

## 2024-10-05 DIAGNOSIS — E7800 Pure hypercholesterolemia, unspecified: Secondary | ICD-10-CM

## 2024-10-05 MED ORDER — ATORVASTATIN CALCIUM 20 MG PO TABS
20.0000 mg | ORAL_TABLET | Freq: Every day | ORAL | 3 refills | Status: AC
  Filled 2024-10-05: qty 90, 90d supply, fill #0

## 2024-10-06 ENCOUNTER — Other Ambulatory Visit: Payer: Self-pay

## 2024-10-07 ENCOUNTER — Other Ambulatory Visit: Payer: Self-pay

## 2024-12-30 ENCOUNTER — Ambulatory Visit: Payer: Self-pay | Admitting: Nurse Practitioner
